# Patient Record
Sex: Male | Born: 1970 | Race: Black or African American | Hispanic: No | Marital: Married | Smoking: Current every day smoker
Health system: Southern US, Community
[De-identification: ages and names within clinical notes are randomized; demographics above are authoritative.]

## PROBLEM LIST (undated history)

## (undated) DIAGNOSIS — K353 Acute appendicitis with localized peritonitis, without perforation or gangrene: Secondary | ICD-10-CM

## (undated) DIAGNOSIS — K3580 Unspecified acute appendicitis: Secondary | ICD-10-CM

## (undated) DIAGNOSIS — T7840XA Allergy, unspecified, initial encounter: Secondary | ICD-10-CM

## (undated) HISTORY — DX: Unspecified acute appendicitis: K35.80

## (undated) HISTORY — DX: Acute appendicitis with localized peritonitis, without perforation or gangrene: K35.30

## (undated) HISTORY — DX: Allergy, unspecified, initial encounter: T78.40XA

## (undated) HISTORY — PX: APPENDECTOMY: SHX54

---

## 2005-07-10 ENCOUNTER — Emergency Department: Payer: Self-pay | Admitting: Emergency Medicine

## 2007-02-02 ENCOUNTER — Ambulatory Visit: Payer: Self-pay | Admitting: Internal Medicine

## 2011-01-31 ENCOUNTER — Emergency Department: Payer: Self-pay | Admitting: Emergency Medicine

## 2013-06-20 ENCOUNTER — Ambulatory Visit: Payer: Self-pay

## 2013-06-20 LAB — RAPID STREP-A WITH REFLX: Micro Text Report: NEGATIVE

## 2013-06-24 LAB — BETA STREP CULTURE(ARMC)

## 2013-10-17 ENCOUNTER — Emergency Department: Payer: Self-pay | Admitting: Emergency Medicine

## 2016-10-04 ENCOUNTER — Encounter
Admission: EM | Disposition: A | Discharge status: Home / Self Care | Payer: Self-pay | Source: Home / Self Care | Attending: Emergency Medicine

## 2016-10-04 ENCOUNTER — Encounter: Payer: Self-pay | Admitting: Emergency Medicine

## 2016-10-04 ENCOUNTER — Emergency Department: Payer: Managed Care, Other (non HMO)

## 2016-10-04 ENCOUNTER — Observation Stay: Payer: Managed Care, Other (non HMO) | Admitting: Certified Registered Nurse Anesthetist

## 2016-10-04 ENCOUNTER — Observation Stay
Admission: EM | Admit: 2016-10-04 | Discharge: 2016-10-04 | Disposition: A | Discharge status: Home / Self Care | Payer: Managed Care, Other (non HMO) | Attending: Surgery | Admitting: Surgery

## 2016-10-04 DIAGNOSIS — K353 Acute appendicitis with localized peritonitis, without perforation or gangrene: Secondary | ICD-10-CM | POA: Diagnosis present

## 2016-10-04 DIAGNOSIS — F17210 Nicotine dependence, cigarettes, uncomplicated: Secondary | ICD-10-CM | POA: Insufficient documentation

## 2016-10-04 DIAGNOSIS — K3580 Unspecified acute appendicitis: Secondary | ICD-10-CM

## 2016-10-04 HISTORY — PX: LAPAROSCOPIC APPENDECTOMY: SHX408

## 2016-10-04 HISTORY — DX: Unspecified acute appendicitis: K35.80

## 2016-10-04 LAB — COMPREHENSIVE METABOLIC PANEL
ALK PHOS: 63 U/L (ref 38–126)
ALT: 19 U/L (ref 17–63)
ANION GAP: 9 (ref 5–15)
AST: 22 U/L (ref 15–41)
Albumin: 3.6 g/dL (ref 3.5–5.0)
BILIRUBIN TOTAL: 0.2 mg/dL — AB (ref 0.3–1.2)
BUN: 8 mg/dL (ref 6–20)
CALCIUM: 8.4 mg/dL — AB (ref 8.9–10.3)
CO2: 28 mmol/L (ref 22–32)
Chloride: 105 mmol/L (ref 101–111)
Creatinine, Ser: 0.93 mg/dL (ref 0.61–1.24)
Glucose, Bld: 107 mg/dL — ABNORMAL HIGH (ref 65–99)
Potassium: 3.5 mmol/L (ref 3.5–5.1)
SODIUM: 142 mmol/L (ref 135–145)
TOTAL PROTEIN: 8.1 g/dL (ref 6.5–8.1)

## 2016-10-04 LAB — URINALYSIS, COMPLETE (UACMP) WITH MICROSCOPIC
BILIRUBIN URINE: NEGATIVE
Bacteria, UA: NONE SEEN
Glucose, UA: NEGATIVE mg/dL
Hgb urine dipstick: NEGATIVE
Ketones, ur: NEGATIVE mg/dL
Leukocytes, UA: NEGATIVE
NITRITE: NEGATIVE
PH: 6 (ref 5.0–8.0)
Protein, ur: NEGATIVE mg/dL
RBC / HPF: NONE SEEN RBC/hpf (ref 0–5)
SPECIFIC GRAVITY, URINE: 1.006 (ref 1.005–1.030)
Squamous Epithelial / LPF: NONE SEEN

## 2016-10-04 LAB — CBC WITH DIFFERENTIAL/PLATELET
Basophils Absolute: 0 10*3/uL (ref 0–0.1)
Basophils Relative: 0 %
Eosinophils Absolute: 0.3 10*3/uL (ref 0–0.7)
Eosinophils Relative: 2 %
HCT: 40.2 % (ref 40.0–52.0)
Hemoglobin: 13.7 g/dL (ref 13.0–18.0)
LYMPHS ABS: 5.2 10*3/uL — AB (ref 1.0–3.6)
Lymphocytes Relative: 43 %
MCH: 25.5 pg — AB (ref 26.0–34.0)
MCHC: 34.1 g/dL (ref 32.0–36.0)
MCV: 75 fL — ABNORMAL LOW (ref 80.0–100.0)
MONOS PCT: 8 %
Monocytes Absolute: 0.9 10*3/uL (ref 0.2–1.0)
NEUTROS ABS: 5.7 10*3/uL (ref 1.4–6.5)
NEUTROS PCT: 47 %
Platelets: 220 10*3/uL (ref 150–440)
RBC: 5.36 MIL/uL (ref 4.40–5.90)
RDW: 16.4 % — ABNORMAL HIGH (ref 11.5–14.5)
WBC: 12.2 10*3/uL — ABNORMAL HIGH (ref 3.8–10.6)

## 2016-10-04 LAB — LIPASE, BLOOD: LIPASE: 33 U/L (ref 11–51)

## 2016-10-04 SURGERY — APPENDECTOMY, LAPAROSCOPIC
Anesthesia: General

## 2016-10-04 MED ORDER — EPHEDRINE SULFATE 50 MG/ML IJ SOLN
INTRAMUSCULAR | Status: AC
  Filled 2016-10-04: qty 1

## 2016-10-04 MED ORDER — LACTATED RINGERS IV SOLN
INTRAVENOUS | Status: DC | PRN
  Administered 2016-10-04: 08:00:00 via INTRAVENOUS

## 2016-10-04 MED ORDER — DEXTROSE-NACL 5-0.9 % IV SOLN: INTRAVENOUS | Status: DC

## 2016-10-04 MED ORDER — SUGAMMADEX SODIUM 200 MG/2ML IV SOLN
INTRAVENOUS | Status: AC
  Filled 2016-10-04: qty 2

## 2016-10-04 MED ORDER — ROCURONIUM BROMIDE 100 MG/10ML IV SOLN
INTRAVENOUS | Status: AC
Start: 2016-10-04 — End: 2016-10-04
  Filled 2016-10-04: qty 1

## 2016-10-04 MED ORDER — HEPARIN SODIUM (PORCINE) 5000 UNIT/ML IJ SOLN: 5000.0000 [IU] | Freq: Three times a day (TID) | INTRAMUSCULAR | Status: DC

## 2016-10-04 MED ORDER — FENTANYL CITRATE (PF) 100 MCG/2ML IJ SOLN
INTRAMUSCULAR | Status: AC
  Filled 2016-10-04: qty 2

## 2016-10-04 MED ORDER — SODIUM CHLORIDE 0.9 % IV BOLUS (SEPSIS)
1000.0000 mL | Freq: Once | INTRAVENOUS | Status: AC
  Administered 2016-10-04: 1000 mL via INTRAVENOUS

## 2016-10-04 MED ORDER — MIDAZOLAM HCL 2 MG/2ML IJ SOLN
INTRAMUSCULAR | Status: AC
  Filled 2016-10-04: qty 2

## 2016-10-04 MED ORDER — FENTANYL CITRATE (PF) 100 MCG/2ML IJ SOLN
INTRAMUSCULAR | Status: DC | PRN
  Administered 2016-10-04: 100 ug via INTRAVENOUS

## 2016-10-04 MED ORDER — SUGAMMADEX SODIUM 200 MG/2ML IV SOLN
INTRAVENOUS | Status: DC | PRN
  Administered 2016-10-04: 200 mg via INTRAVENOUS

## 2016-10-04 MED ORDER — LIDOCAINE HCL (PF) 2 % IJ SOLN
INTRAMUSCULAR | Status: AC
  Filled 2016-10-04: qty 2

## 2016-10-04 MED ORDER — FENTANYL CITRATE (PF) 100 MCG/2ML IJ SOLN: 25.0000 ug | INTRAMUSCULAR | Status: DC | PRN

## 2016-10-04 MED ORDER — PROPOFOL 10 MG/ML IV BOLUS
INTRAVENOUS | Status: DC | PRN
  Administered 2016-10-04: 200 mg via INTRAVENOUS

## 2016-10-04 MED ORDER — IOPAMIDOL (ISOVUE-300) INJECTION 61%
30.0000 mL | Freq: Once | INTRAVENOUS | Status: AC | PRN
  Administered 2016-10-04: 30 mL via ORAL

## 2016-10-04 MED ORDER — HYDROMORPHONE HCL 1 MG/ML IJ SOLN
INTRAMUSCULAR | Status: AC
  Filled 2016-10-04: qty 1

## 2016-10-04 MED ORDER — GLYCOPYRROLATE 0.2 MG/ML IJ SOLN
INTRAMUSCULAR | Status: AC
  Filled 2016-10-04: qty 1

## 2016-10-04 MED ORDER — MORPHINE SULFATE (PF) 4 MG/ML IV SOLN: 4.0000 mg | INTRAVENOUS | Status: DC | PRN

## 2016-10-04 MED ORDER — ONDANSETRON HCL 4 MG/2ML IJ SOLN: 4.0000 mg | Freq: Once | INTRAMUSCULAR | Status: DC | PRN

## 2016-10-04 MED ORDER — ONDANSETRON HCL 4 MG/2ML IJ SOLN
INTRAMUSCULAR | Status: AC
  Filled 2016-10-04: qty 2

## 2016-10-04 MED ORDER — MIDAZOLAM HCL 2 MG/2ML IJ SOLN
INTRAMUSCULAR | Status: DC | PRN
  Administered 2016-10-04: 2 mg via INTRAVENOUS

## 2016-10-04 MED ORDER — HYDROMORPHONE HCL 1 MG/ML IJ SOLN
INTRAMUSCULAR | Status: DC | PRN
  Administered 2016-10-04: 1 mg via INTRAVENOUS

## 2016-10-04 MED ORDER — ONDANSETRON HCL 4 MG/2ML IJ SOLN: 4.0000 mg | Freq: Four times a day (QID) | INTRAMUSCULAR | Status: DC | PRN

## 2016-10-04 MED ORDER — HYDROCODONE-ACETAMINOPHEN 5-325 MG PO TABS
1.0000 | ORAL_TABLET | ORAL | Status: DC | PRN
  Administered 2016-10-04: 1 via ORAL
  Filled 2016-10-04: qty 1

## 2016-10-04 MED ORDER — HYDROCODONE-ACETAMINOPHEN 5-325 MG PO TABS: 1.0000 | ORAL_TABLET | ORAL | 0 refills | Status: DC | PRN

## 2016-10-04 MED ORDER — PIPERACILLIN-TAZOBACTAM 3.375 G IVPB
INTRAVENOUS | Status: AC
  Filled 2016-10-04: qty 50

## 2016-10-04 MED ORDER — BUPIVACAINE-EPINEPHRINE (PF) 0.25% -1:200000 IJ SOLN
INTRAMUSCULAR | Status: DC | PRN
Start: 2016-10-04 — End: 2016-10-04
  Administered 2016-10-04: 30 mL via PERINEURAL

## 2016-10-04 MED ORDER — SUCCINYLCHOLINE CHLORIDE 20 MG/ML IJ SOLN
INTRAMUSCULAR | Status: DC | PRN
  Administered 2016-10-04: 100 mg via INTRAVENOUS

## 2016-10-04 MED ORDER — SUCCINYLCHOLINE CHLORIDE 20 MG/ML IJ SOLN
INTRAMUSCULAR | Status: AC
  Filled 2016-10-04: qty 1

## 2016-10-04 MED ORDER — DEXAMETHASONE SODIUM PHOSPHATE 10 MG/ML IJ SOLN
INTRAMUSCULAR | Status: DC | PRN
  Administered 2016-10-04: 10 mg via INTRAVENOUS

## 2016-10-04 MED ORDER — LIDOCAINE HCL (CARDIAC) 20 MG/ML IV SOLN
INTRAVENOUS | Status: DC | PRN
Start: 2016-10-04 — End: 2016-10-04
  Administered 2016-10-04: 100 mg via INTRAVENOUS

## 2016-10-04 MED ORDER — IOPAMIDOL (ISOVUE-300) INJECTION 61%
100.0000 mL | Freq: Once | INTRAVENOUS | Status: AC | PRN
  Administered 2016-10-04: 100 mL via INTRAVENOUS

## 2016-10-04 MED ORDER — ONDANSETRON HCL 4 MG/2ML IJ SOLN
INTRAMUSCULAR | Status: DC | PRN
  Administered 2016-10-04: 4 mg via INTRAVENOUS

## 2016-10-04 MED ORDER — DEXAMETHASONE SODIUM PHOSPHATE 10 MG/ML IJ SOLN
INTRAMUSCULAR | Status: AC
  Filled 2016-10-04: qty 1

## 2016-10-04 MED ORDER — BUPIVACAINE-EPINEPHRINE (PF) 0.25% -1:200000 IJ SOLN
INTRAMUSCULAR | Status: AC
  Filled 2016-10-04: qty 30

## 2016-10-04 MED ORDER — ONDANSETRON HCL 4 MG PO TABS: 4.0000 mg | ORAL_TABLET | Freq: Four times a day (QID) | ORAL | Status: DC | PRN

## 2016-10-04 MED ORDER — PHENYLEPHRINE HCL 10 MG/ML IJ SOLN
INTRAMUSCULAR | Status: AC
  Filled 2016-10-04: qty 1

## 2016-10-04 MED ORDER — ROCURONIUM BROMIDE 100 MG/10ML IV SOLN
INTRAVENOUS | Status: DC | PRN
  Administered 2016-10-04: 30 mg via INTRAVENOUS
  Administered 2016-10-04: 5 mg via INTRAVENOUS

## 2016-10-04 MED ORDER — PROPOFOL 10 MG/ML IV BOLUS
INTRAVENOUS | Status: AC
  Filled 2016-10-04: qty 20

## 2016-10-04 SURGICAL SUPPLY — 41 items
ADHESIVE MASTISOL STRL (MISCELLANEOUS) ×3 IMPLANT
APPLIER CLIP ROT 10 11.4 M/L (STAPLE) ×3
BLADE SURG SZ11 CARB STEEL (BLADE) ×3 IMPLANT
CANISTER SUCT 3000ML PPV (MISCELLANEOUS) ×3 IMPLANT
CATH TRAY 16F METER LATEX (MISCELLANEOUS) ×3 IMPLANT
CHLORAPREP W/TINT 26ML (MISCELLANEOUS) ×3 IMPLANT
CLIP APPLIE ROT 10 11.4 M/L (STAPLE) ×1 IMPLANT
CLOSURE WOUND 1/2 X4 (GAUZE/BANDAGES/DRESSINGS) ×1
CUTTER FLEX LINEAR 45M (STAPLE) ×3 IMPLANT
DEVICE TROCAR PUNCTURE CLOSURE (ENDOMECHANICALS) ×3 IMPLANT
ELECT REM PT RETURN 9FT ADLT (ELECTROSURGICAL) ×3
ELECTRODE REM PT RTRN 9FT ADLT (ELECTROSURGICAL) ×1 IMPLANT
ENDOPOUCH RETRIEVER 10 (MISCELLANEOUS) ×3 IMPLANT
GAUZE SPONGE NON-WVN 2X2 STRL (MISCELLANEOUS) ×3 IMPLANT
GLOVE BIO SURGEON STRL SZ8 (GLOVE) ×3 IMPLANT
GOWN STRL REUS W/ TWL LRG LVL3 (GOWN DISPOSABLE) ×2 IMPLANT
GOWN STRL REUS W/TWL LRG LVL3 (GOWN DISPOSABLE) ×4
IRRIGATION STRYKERFLOW (MISCELLANEOUS) ×1 IMPLANT
IRRIGATOR STRYKERFLOW (MISCELLANEOUS) ×3
KIT RM TURNOVER STRD PROC AR (KITS) ×3 IMPLANT
LABEL OR SOLS (LABEL) ×3 IMPLANT
NDL SAFETY 22GX1.5 (NEEDLE) ×3 IMPLANT
NEEDLE VERESS 14GA 120MM (NEEDLE) ×3 IMPLANT
NS IRRIG 500ML POUR BTL (IV SOLUTION) ×3 IMPLANT
PACK LAP CHOLECYSTECTOMY (MISCELLANEOUS) ×3 IMPLANT
RELOAD 45 VASCULAR/THIN (ENDOMECHANICALS) ×3 IMPLANT
RELOAD STAPLE TA45 3.5 REG BLU (ENDOMECHANICALS) ×3 IMPLANT
SCISSORS METZENBAUM CVD 33 (INSTRUMENTS) IMPLANT
SLEEVE ENDOPATH XCEL 5M (ENDOMECHANICALS) ×3 IMPLANT
SOL .9 NS 3000ML IRR  AL (IV SOLUTION) ×2
SOL .9 NS 3000ML IRR UROMATIC (IV SOLUTION) ×1 IMPLANT
SPONGE LAP 18X18 5 PK (GAUZE/BANDAGES/DRESSINGS) ×3 IMPLANT
SPONGE VERSALON 2X2 STRL (MISCELLANEOUS) ×6
STRIP CLOSURE SKIN 1/2X4 (GAUZE/BANDAGES/DRESSINGS) ×2 IMPLANT
SUT MNCRL 4-0 (SUTURE) ×2
SUT MNCRL 4-0 27XMFL (SUTURE) ×1
SUT VICRYL 0 TIES 12 18 (SUTURE) ×3 IMPLANT
SUTURE MNCRL 4-0 27XMF (SUTURE) ×1 IMPLANT
TROCAR XCEL 12X100 BLDLESS (ENDOMECHANICALS) ×3 IMPLANT
TROCAR XCEL NON-BLD 5MMX100MML (ENDOMECHANICALS) ×3 IMPLANT
TUBING INSUFFLATOR HI FLOW (MISCELLANEOUS) ×3 IMPLANT

## 2016-10-04 NOTE — ED Triage Notes (Signed)
Pt presents to ED 09 c/o RLQ abdominal pain that started last night; per EMS pain was at 8-9/10; EMS gave 15mg  toradol and pain decreased to 5-6/10; EMS also gave 4mg  zofran for nausea; pt is awake, alert, and oriented x4;

## 2016-10-04 NOTE — Anesthesia Post-op Follow-up Note (Cosign Needed)
Anesthesia QCDR form completed.        

## 2016-10-04 NOTE — H&P (Signed)
Patient ID: Aaron Woodard, male   DOB: [birth year], 46 y.o.   MRN: [id number]  HPI Aaron Woodard is a 46 y.o. male presented with acute onset of abdominal pain starting last night around 11:00 pm. He reports that the pain is located in the right lower quadrant, is constant and sharp and moderate in intensity. Worsen with certain movements. He did have associated nausea and decreased appetite. No emesis, no fevers no chills no diarrhea. His workup revealed an increase in the white count of 12,000 normal creatinine. CT scan personal review there is evidence of dilated appendix with periappendiceal inflammation. No abscesses no free air. He is able to perform more than 6 Mets of activity without shortness of breath or chest pain. He does drink 2-3 beers 3 times a week and smokes daily  HPI   History reviewed. No pertinent past medical history.  History reviewed. No pertinent surgical history.  No pertinent fam hx Social History Social History  Substance Use Topics  . Smoking status: Current Every Day Smoker    Packs/day: 0.50    Types: Cigarettes  . Smokeless tobacco: Never Used  . Alcohol use 6.0 - 8.4 oz/week    10 - 14 Standard drinks or equivalent per week    Not on File  No current facility-administered medications for this encounter.    No current outpatient prescriptions on file.     Review of Systems Full ROS  was asked and was negative except for the information on the HPI  Physical Exam Blood pressure (!) 118/91, pulse 75, temperature 98.1 F (36.7 C), temperature source Oral, resp. rate 15, height 5\' 10"  (1.778 m), weight 104.3 kg (230 lb), SpO2 98 %. CONSTITUTIONAL: *NAD EYES: Pupils are equal, round, and reactive to light, Sclera are non-icteric. EARS, NOSE, MOUTH AND THROAT: The oropharynx is clear. The oral mucosa is pink and moist. Hearing is intact to voice. LYMPH NODES:  Lymph nodes in the neck are normal. RESPIRATORY:  Lungs are clear. There is normal  respiratory effort, with equal breath sounds bilaterally, and without pathologic use of accessory muscles. CARDIOVASCULAR: Heart is regular without murmurs, gallops, or rubs. GI: The abdomen is  soft, TTP RLQ w  positive Mcburney sign GU: Rectal deferred.   MUSCULOSKELETAL: Normal muscle strength and tone. No cyanosis or edema.   SKIN: Turgor is good and there are no pathologic skin lesions or ulcers. NEUROLOGIC: Motor and sensation is grossly normal. Cranial nerves are grossly intact. PSYCH:  Oriented to person, place and time. Affect is normal.  Data Reviewed I have personally reviewed the patient's imaging, laboratory findings and medical records.    Assessment/ Plan 46 year old male with signs and symptoms consistent with appendicitis and confirmed by CT scan. Discussed with the patient about his disease process and I do recommend admission to hospital and proceeding with an appendectomy. We'll go ahead and give him some fluids, broad-spectrum antibiotics and is scheduled for an appendectomy. The risks, benefits, complications, treatment options, and expected outcomes were discussed with the patient. The treatment of antibiotics alone was discussed giving a 20% chance that this could fail and surgery would be necessary.  Also discussed continuing to the operating room for Laparoscopic Appendectomy.  The possibilities of  bleeding, recurrent infection, perforation of viscus, finding a normal appendix, the need for additional procedures, failure to diagnose a condition, conversion to open procedure and creating a complication requiring transfusion or further operations were discussed. The patient was given the opportunity to ask questions and have  them answered.  Patient would like to proceed with Laparoscopic Appendectomy. D/W OR Team and there is space available at [facility]. Dr. Burt Knack will be performing the procedure. D/w the pt in detail and he understands.  Caroleen Hamman, MD FACS General  Surgeon 10/04/2016,

## 2016-10-04 NOTE — Anesthesia Preprocedure Evaluation (Addendum)
Anesthesia Evaluation  Patient identified by MRN, date of birth, ID band Patient awake    Reviewed: Allergy & Precautions, NPO status , Patient's Chart, lab work & pertinent test results  Airway Mallampati: III  TM Distance: >3 FB     Dental  (+) Chipped   Pulmonary Current Smoker,    Pulmonary exam normal        Cardiovascular negative cardio ROS Normal cardiovascular exam     Neuro/Psych negative neurological ROS  negative psych ROS   GI/Hepatic Neg liver ROS, Acute appendix   Endo/Other  negative endocrine ROS  Renal/GU negative Renal ROS     Musculoskeletal negative musculoskeletal ROS (+)   Abdominal (+) + obese,  Abdomen: tender.    Peds negative pediatric ROS (+)  Hematology negative hematology ROS (+)   Anesthesia Other Findings History reviewed. No pertinent past medical history.  Reproductive/Obstetrics                            Anesthesia Physical Anesthesia Plan  ASA: II and emergent  Anesthesia Plan: General   Post-op Pain Management:    Induction: Intravenous and Rapid sequence  Airway Management Planned: Oral ETT  Additional Equipment:   Intra-op Plan:   Post-operative Plan: Extubation in OR  Informed Consent: I have reviewed the patients History and Physical, chart, labs and discussed the procedure including the risks, benefits and alternatives for the proposed anesthesia with the patient or authorized representative who has indicated his/her understanding and acceptance.   Dental advisory given  Plan Discussed with: CRNA and Surgeon  Anesthesia Plan Comments:         Anesthesia Quick Evaluation

## 2016-10-04 NOTE — Anesthesia Procedure Notes (Signed)
Procedure Name: Intubation Date/Time: 10/04/2016 8:17 AM Performed by: Darlyne Russian Pre-anesthesia Checklist: Patient identified, Emergency Drugs available, Suction available, Patient being monitored and Timeout performed Patient Re-evaluated:Patient Re-evaluated prior to inductionOxygen Delivery Method: Circle system utilized Preoxygenation: Pre-oxygenation with 100% oxygen Intubation Type: IV induction Ventilation: Oral airway inserted - appropriate to patient size and Mask ventilation with difficulty Laryngoscope Size: Mac and 4 Grade View: Grade III Tube type: Oral Tube size: 7.5 mm Number of attempts: 1 Airway Equipment and Method: Stylet and Bite block (Soft bite block placed between left molars.) Placement Confirmation: ETT inserted through vocal cords under direct vision,  positive ETCO2 and breath sounds checked- equal and bilateral Secured at: 24 cm Tube secured with: Tape Dental Injury: Teeth and Oropharynx as per pre-operative assessment

## 2016-10-04 NOTE — ED Provider Notes (Signed)
[facility] Emergency Department Provider Note    First MD Initiated Contact with Patient 10/04/16 [phone]     (approximate)  I have reviewed the triage vital signs and the nursing notes.   HISTORY  Chief Complaint Abdominal Pain   HPI Aaron Woodard is a 46 y.o. male presents with acute onset of 10 out of 10 sharp right lower quadrant painand nausea.    Past medical history Noncontributory  Patient Active Problem List   Diagnosis Date Noted  . Acute appendicitis with localized peritonitis     Past surgical history None  Prior to Admission medications   Not on File    Allergies No known drug allergies  Family history Noncontributory  Social History Social History  Substance Use Topics  . Smoking status: Current Every Day Smoker    Packs/day: 0.50    Types: Cigarettes  . Smokeless tobacco: Never Used  . Alcohol use 6.0 - 8.4 oz/week    10 - 14 Standard drinks or equivalent per week    Review of Systems Constitutional: No fever/chills Eyes: No visual changes. ENT: No sore throat. Cardiovascular: Denies chest pain. Respiratory: Denies shortness of breath. Gastrointestinal: Positive for abdominal pain.  Positive for nausea, no vomiting.  No diarrhea.  No constipation. Genitourinary: Negative for dysuria. Musculoskeletal: Negative for neck pain.  Negative for back pain. Integumentary: Negative for rash. Neurological: Negative for headaches, focal weakness or numbness.   ____________________________________________   PHYSICAL EXAM:  VITAL SIGNS: ED Triage Vitals  Enc Vitals Group     BP 10/04/16 0100 (!) 118/91     Pulse Rate 10/04/16 0100 67     Resp 10/04/16 0104 15     Temp 10/04/16 0104 98.1 F (36.7 C)     Temp Source 10/04/16 0104 Oral     SpO2 10/04/16 0100 96 %     Weight 10/04/16 0104 230 lb (104.3 kg)     Height 10/04/16 0104 5\' 10"  (1.778 m)     Head Circumference --      Peak Flow --      Pain Score  10/04/16 0103 5     Pain Loc --      Pain Edu? --      Excl. in [place]? --     Constitutional: Alert and oriented. Well appearing and in no acute distress. Eyes: Conjunctivae are normal.  Head: Atraumatic. Mouth/Throat: Mucous membranes are moist. Oropharynx non-erythematous. Neck: No stridor.  Cardiovascular: Normal rate, regular rhythm. Good peripheral circulation. Grossly normal heart sounds. Respiratory: Normal respiratory effort.  No retractions. Lungs CTAB. Gastrointestinal: Right lower quadrant tenderness to palpation. No distention.  Musculoskeletal: No lower extremity tenderness nor edema. No gross deformities of extremities. Neurologic:  Normal speech and language. No gross focal neurologic deficits are appreciated.  Skin:  Skin is warm, dry and intact. No rash noted. Psychiatric: Mood and affect are normal. Speech and behavior are normal.  ____________________________________________   LABS (all labs ordered are listed, but only abnormal results are displayed)  Labs Reviewed  CBC WITH DIFFERENTIAL/PLATELET - Abnormal; Notable for the following:       Result Value   WBC 12.2 (*)    MCV 75.0 (*)    MCH 25.5 (*)    RDW 16.4 (*)    Lymphs Abs 5.2 (*)    All other components within normal limits  COMPREHENSIVE METABOLIC PANEL - Abnormal; Notable for the following:    Glucose, Bld 107 (*)    Calcium 8.4 (*)  Total Bilirubin 0.2 (*)    All other components within normal limits  URINALYSIS, COMPLETE (UACMP) WITH MICROSCOPIC - Abnormal; Notable for the following:    Color, Urine YELLOW (*)    APPearance CLEAR (*)    All other components within normal limits  LIPASE, BLOOD     RADIOLOGY I, Woodland N Tabor Denham, personally viewed and evaluated these images (plain radiographs) as part of my medical decision making, as well as reviewing the written report by the radiologist.  Ct Abdomen Pelvis W Contrast  Result Date: 10/04/2016 CLINICAL DATA:  Right lower quadrant  abdominal pain. EXAM: CT ABDOMEN AND PELVIS WITH CONTRAST TECHNIQUE: Multidetector CT imaging of the abdomen and pelvis was performed using the standard protocol following bolus administration of intravenous contrast. CONTRAST:  154mL ISOVUE-300 IOPAMIDOL (ISOVUE-300) INJECTION 61% COMPARISON:  None. FINDINGS: Lower chest: Minimal dependent atelectasis. Lung bases are otherwise clear. Hepatobiliary: Subcentimeter hypodensity in the left lobe is too small to characterize, likely small cyst or hemangioma. Gallbladder is decompressed, no calcified gallstone. No biliary dilatation. Pancreas: No ductal dilatation or inflammation. Spleen: Normal in size without focal abnormality. Adrenals/Urinary Tract: Adrenal glands are unremarkable. Kidneys are normal, without renal calculi, focal lesion, or hydronephrosis. Bladder is distended, otherwise unremarkable. Stomach/Bowel: The appendix is borderline measuring 7-8 mm, possible wall thickening proximally. There is air at the appendiceal tip. There is equivocal periappendiceal soft tissue stranding. Fecalization of distal small bowel contents suggest slow transit and may be due to appendiceal inflammation. No obstruction. No small or large bowel wall thickening. Stomach physiologically distended with enteric contrast. Trace hiatal hernia. Vascular/Lymphatic: Mild bi-iliac atherosclerosis. No aneurysm. No adenopathy. Reproductive: Prostate is unremarkable. Other: No free air, free fluid, or intra-abdominal fluid collection. Tiny fat containing umbilical hernia. Musculoskeletal: There is sclerosis involving the sacrum and iliac aspect of both sacroiliac joints. This appears relatively symmetric. Scleroses involves enlarged left transverse process of L5. No acute fracture. IMPRESSION: 1. Findings suspicious for early acute appendicitis. Borderline appendiceal dilatation, wall thickening and faint periappendiceal soft tissue stranding. 2. Symmetric scleroses about bilateral  sacroiliac joints. This suggests sacroiliitis, which can be seen with ankylosing spondylitis, inflammatory bowel disease or psoriatic arthritis, however the latter is usually more asymmetric. Electronically Signed   By: Jeb Levering M.D.   On: 10/04/2016 02:53     Procedures   ____________________________________________   INITIAL IMPRESSION / ASSESSMENT AND PLAN / ED COURSE  Pertinent labs & imaging results that were available during my care of the patient were reviewed by me and considered in my medical decision making (see chart for details).  History of physical exam concern for possible appendicitis as such CT scan of the abdomen and pelvis performed which revealed acute appendicitis. I informed the patient of all clinical findings. Dr.Pabon evaluated the patient in the emergency department & concurred with CT findings. Plan for appendectomy      ____________________________________________  FINAL CLINICAL IMPRESSION(S) / ED DIAGNOSES  Final diagnoses:  Acute appendicitis, unspecified acute appendicitis type     MEDICATIONS GIVEN DURING THIS VISIT:  Medications  sodium chloride 0.9 % bolus 1,000 mL (0 mLs Intravenous Stopped 10/04/16 0235)  iopamidol (ISOVUE-300) 61 % injection 30 mL (30 mLs Oral Contrast Given 10/04/16 0118)  iopamidol (ISOVUE-300) 61 % injection 100 mL (100 mLs Intravenous Contrast Given 10/04/16 0212)     NEW OUTPATIENT MEDICATIONS STARTED DURING THIS VISIT:  New Prescriptions   No medications on file    Modified Medications   No medications on file  Discontinued Medications   No medications on file     Note:  This document was prepared using Dragon voice recognition software and may include unintentional dictation errors.    Gregor Hams, MD 10/04/16 0400

## 2016-10-04 NOTE — Progress Notes (Signed)
Patient feels well and wants to be discharged his tolerating a regular diet. Wounds are clean with dressings in place

## 2016-10-04 NOTE — Op Note (Signed)
laparascopic appendectomy   [name] Date of operation:  10/04/2016  Indications: The patient presented with a history of  abdominal pain. Workup has revealed findings consistent with acute appendicitis.  Pre-operative Diagnosis: Acute appendicitis  Post-operative Diagnosis: Acute appendicitis, nonruptured  Surgeon: Jerrol Banana. Burt Knack, MD, FACS  Anesthesia: General with endotracheal tube  Procedure Details  The patient was seen again in the preop area. The options of surgery versus observation were reviewed with the patient and/or family. The risks of bleeding, infection, recurrence of symptoms, negative laparoscopy, potential for an open procedure, bowel injury, abscess or infection, were all reviewed as well. The patient was taken to Operating Room, identified as Aaron Woodard and the procedure verified as laparoscopic appendectomy. A Time Out was held and the above information confirmed.  The patient was placed in the supine position and general anesthesia was induced.  Antibiotic prophylaxis was administered and VT E prophylaxis was in place. A Foley catheter was placed by the nursing staff.   The abdomen was prepped and draped in a sterile fashion. An infraumbilical incision was made. A Veress needle was placed and pneumoperitoneum was obtained. A 5 mm trocar port was placed without difficulty and the abdominal cavity was explored.  Under direct vision a 5 mm suprapubic port was placed and a 13 mm left lateral port was placed all under direct vision.  The appendix was identified and found to be acutely inflamed but nonruptured . The appendix was carefully dissected. The base of the appendix was dissected out and divided with a standard load Endo GIA. The mesoappendix was divided with a vascular load Endo GIA. 2 loads were required. There was considerable bleeding along the cyst vascular staple line which was reinforced and controlled with clips. The appendix was passed out through the  left lateral port site with the aid of an Endo Catch bag. The right lower quadrant and pelvis was then irrigated with copious amounts of normal saline which was aspirated. Inspection  failed to identify any additional bleeding and there were no signs of bowel injury. Therefore the left lateral port site was closed under direct vision utilizing an Endo Close technique with 0 Vicryl interrupted sutures, all under direct vision.   Again the right lower quadrant was inspected there was no sign of bleeding or bowel injury therefore pneumoperitoneum was released, all ports were removed and the skin incisions were approximated with subcuticular 4-0 Monocryl. Steri-Strips and Mastisol and sterile dressings were placed.  The patient tolerated the procedure well, there were no complications. The sponge lap and needle count were correct at the end of the procedure.  The patient was taken to the recovery room in stable condition to be admitted for continued care.  Findings acute appendicitis nonruptured  Estimated Blood Loss: 20 cc                  Specimens: appendix         Complications:  None                  Aaron Woodard E. Burt Knack MD, FACS

## 2016-10-04 NOTE — Addendum Note (Signed)
Addendum  created 10/04/16 1006 by Darlyne Russian, CRNA   Anesthesia Intra Flowsheets edited

## 2016-10-04 NOTE — ED Notes (Signed)
Pt transported to OR

## 2016-10-04 NOTE — Progress Notes (Signed)
Preoperative Review   Patient is met in the ED. The history is reviewed in the chart and with the patient. I personally reviewed the options and rationale as well as the risks of this procedure that have been previously discussed with the patient. All questions asked by the patient and/or family were answered to their satisfaction.  Patient agrees to proceed with this procedure at this time.  Florene Glen M.D. FACS

## 2016-10-04 NOTE — Anesthesia Postprocedure Evaluation (Signed)
Anesthesia Post Note  Patient: Aaron Woodard  Procedure(s) Performed: Procedure(s) (LRB): APPENDECTOMY LAPAROSCOPIC (N/A)  Patient location during evaluation: PACU Anesthesia Type: General Level of consciousness: awake and alert and oriented Pain management: pain level controlled Vital Signs Assessment: post-procedure vital signs reviewed and stable Respiratory status: spontaneous breathing Cardiovascular status: blood pressure returned to baseline Anesthetic complications: no     Last Vitals:  Vitals:   10/04/16 0630 10/04/16 0923  BP: 101/74 121/81  Pulse: 61 93  Resp:  (!) 27  Temp:  36.7 C    Last Pain:  Vitals:   10/04/16 0923  TempSrc: Temporal  PainSc: Asleep                 [name]

## 2016-10-04 NOTE — Progress Notes (Signed)
Patient discharged to home. Concerns addressed. IV site removed  

## 2016-10-04 NOTE — Transfer of Care (Signed)
Immediate Anesthesia Transfer of Care Note  Patient: Aaron Woodard  Procedure(s) Performed: Procedure(s): APPENDECTOMY LAPAROSCOPIC (N/A)  Patient Location: PACU  Anesthesia Type:General  Level of Consciousness: drowsy and responds to stimulation  Airway & Oxygen Therapy: Patient Spontanous Breathing and Patient connected to nasal cannula oxygen  Post-op Assessment: Report given to RN and Post -op Vital signs reviewed and stable  Post vital signs: Reviewed and stable  Last Vitals:  Vitals:   10/04/16 0630 10/04/16 0923  BP: 101/74 121/81  Pulse: 61 93  Resp:  (!) 27  Temp:  36.7 C    Last Pain:  Vitals:   10/04/16 0923  TempSrc: Temporal  PainSc: 0-No pain         Complications: No apparent anesthesia complications

## 2016-10-04 NOTE — Discharge Instructions (Signed)
Remove dressing in 24 hours. °May shower in 24 hours. °Leave paper strips in place. °Resume all home medications. °Follow-up with Dr. Deneka Greenwalt in 10 days. °

## 2016-10-04 NOTE — Discharge Summary (Signed)
Physician Discharge Summary  Patient ID: Aaron Woodard MRN: [id number] DOB/AGE: Dec 17, 1970 46 y.o.  Admit date: 10/04/2016 Discharge date: 10/04/2016   Discharge Diagnoses:  Active Problems:   Acute appendicitis with localized peritonitis   Appendicitis, acute   Procedures:Laparoscopic appendectomy  Hospital Course: This patient admitted the hospital with a diagnosis of acute appendicitis he was taken the operating room where laparoscopic appendectomy was performed without difficulty. He is tolerating a regular diet wishes to be discharged at this time he is given instructions to remove dressing tomorrow leaving Steri-Strips in place and to shower tomorrow he will follow-up in our office in 10 days no heavy lifting.  Consults: None  Disposition: Final discharge disposition not confirmed   Allergies as of 10/04/2016   No Known Allergies     Medication List    TAKE these medications   HYDROcodone-acetaminophen 5-325 MG tablet Commonly known as:  NORCO/VICODIN Take 1-2 tablets by mouth every 4 (four) hours as needed for moderate pain.      Follow-up Information    Florene Glen, MD Follow up in 10 day(s).   Specialty:  Surgery Contact information: [address] [place] [address] [phone]           Florene Glen, MD, FACS

## 2016-10-05 ENCOUNTER — Encounter: Payer: Self-pay | Admitting: Surgery

## 2016-10-08 LAB — SURGICAL PATHOLOGY

## 2016-10-14 ENCOUNTER — Telehealth: Payer: Self-pay | Admitting: General Practice

## 2016-10-14 NOTE — Telephone Encounter (Signed)
Called patient back and asked how long he had been with the spasms. He stated that he started having the spasms two days ago and that they radiate from the front to back. Patient denied fever, chills, redness and drainage from his incisions. I asked patient how often he was taking pain medications if any. He stated that he continues to take Hydrocodone-Acetaminophen 5-325 MG twice a day since he started with the spasms. He stated that the pain medication is helping him but wanted to know what else he could do. I recommended for him to drink plenty of fluids, walk around to help his bowels moving since he stated that he is going once every two days. Therefore, I also recommended for him to take Miralax 17 G twice a day to help him with his constipation since to me it sounded more like constipation. Patient was told to give [place] a call in case he continued to have the spasms after his second dosage of Miralax. Patient understood and had no further questions. I reminded him of his appointment for Thursday 10/16/2016.

## 2016-10-14 NOTE — Telephone Encounter (Signed)
Patient is calling, he is complaining of spasms from the front to the back, just started a couple days ago. On the left side gives him a little pain especially when he starts to spasm, pain level being at a 3 or 4. Patient is calling asking if this is normal. He is coming in on Thursday to see Dr. Adonis Huguenin. Please call and advice.

## 2016-10-16 ENCOUNTER — Ambulatory Visit (INDEPENDENT_AMBULATORY_CARE_PROVIDER_SITE_OTHER): Payer: Managed Care, Other (non HMO) | Admitting: General Surgery

## 2016-10-16 ENCOUNTER — Encounter: Payer: Self-pay | Admitting: General Surgery

## 2016-10-16 VITALS — BP 168/84 | HR 80 | Temp 97.8°F | Ht 70.0 in | Wt 217.4 lb

## 2016-10-16 DIAGNOSIS — Z4889 Encounter for other specified surgical aftercare: Secondary | ICD-10-CM | POA: Diagnosis not present

## 2016-10-16 MED ORDER — TRAMADOL HCL 50 MG PO TABS: 50.0000 mg | ORAL_TABLET | Freq: Four times a day (QID) | ORAL | 0 refills | Status: DC | PRN

## 2016-10-16 NOTE — Patient Instructions (Signed)
We will see you next week to make sure that you are doing better. Then we will determine when you could return to work.

## 2016-10-16 NOTE — Progress Notes (Signed)
Outpatient Surgical Follow Up  10/16/2016  Aaron Woodard is an 46 y.o. male.   Chief Complaint  Patient presents with  . Routine Post Op    Laparoscopic Appendectomy (10/04/16)- Dr. Burt Woodard    HPI: 46 year old male returns to clinic for follow-up 2 weeks status post laparoscopic appendectomy. Patient reports she is eating well but still having abdominal pain. He reports that he has spasms to go across his entire abdomen starting his left lower quadrant. This has intensified over the last couple days. They come out of the blue and require medications and respiratory resolve. Otherwise he states he is doing well. He denies any fevers, chills, nausea, vomiting, chest pain, shortness of breath, diarrhea, constipation.  Past Medical History:  Diagnosis Date  . Acute appendicitis with localized peritonitis   . Appendicitis, acute 10/04/2016    Past Surgical History:  Procedure Laterality Date  . LAPAROSCOPIC APPENDECTOMY N/A 10/04/2016   Procedure: APPENDECTOMY LAPAROSCOPIC;  Surgeon: Aaron Glen, MD;  Location: [facility] ORS;  Service: General;  Laterality: N/A;    Family History  Problem Relation Age of Onset  . Diabetes Mother   . Hypertension Mother   . Heart disease Mother   . Healthy Father     Social History:  reports that he has been smoking Cigarettes.  He has been smoking about 0.50 packs per day. He has never used smokeless tobacco. He reports that he drinks about 6.0 - 8.4 oz of alcohol per week . He reports that he does not use drugs.  Allergies: No Known Allergies  Medications reviewed.    ROS A multipoint review of systems was complete, all pertinent positives and negatives are documented within the history of present illness and remainder are negative   BP (!) 168/84   Pulse 80   Temp 97.8 F (36.6 C) (Oral)   Ht 5\' 10"  (1.778 m)   Wt 98.6 kg (217 lb 6.4 oz)   BMI 31.19 kg/m   Physical Exam Gen.: No acute distress Neck: Supple and nontender Chest: Clear  to auscultation Heart: Regular rate and rhythm Abdomen: Soft, nondistended. Minimally tender to palpation around the incision sites especially left lower quadrant. His laparoscopic incision sites are well approximated without any evidence of erythema or drainage.    No results found for this or any previous visit (from the past 48 hour(s)). No results found.  Assessment/Plan:  1. Aftercare following surgery 46 year old male status post laparoscopic appendectomy. Still having significant abdominal pain and spasms. We will provide him with a prescription for Ultram today and counseled him for strategies for relieving abdominal spasm. Patient voiced understanding and will follow-up in clinic next week to see his operative surgeon prior to being released to return to work.     Aaron Pert, MD FACS General Surgeon  10/16/2016,2:50 PM

## 2016-10-22 ENCOUNTER — Encounter: Payer: Self-pay | Admitting: Surgery

## 2016-10-22 ENCOUNTER — Ambulatory Visit (INDEPENDENT_AMBULATORY_CARE_PROVIDER_SITE_OTHER): Payer: Managed Care, Other (non HMO) | Admitting: Surgery

## 2016-10-22 VITALS — BP 133/85 | HR 80 | Temp 98.5°F | Ht 70.0 in | Wt 219.8 lb

## 2016-10-22 DIAGNOSIS — Z4889 Encounter for other specified surgical aftercare: Secondary | ICD-10-CM

## 2016-10-22 NOTE — Progress Notes (Signed)
Outpatient postop visit  10/22/2016  Aaron Woodard is an 46 y.o. male.    Procedure: Lap or scopic appendectomy  CC no problems  HPI: Patient feels well and is having no problems now he is tolerating his diet.  Medications reviewed.    Physical Exam:  BP 133/85   Pulse 80   Temp 98.5 F (36.9 C) (Oral)   Ht 5\' 10"  (1.778 m)   Wt 219 lb 12.8 oz (99.7 kg)   BMI 31.54 kg/m     PE: *Soft nontender abdomen no erythema no drainage wounds are clean    Assessment/Plan:  Pathology reviewed. Patient doing very well recommend follow up on an as-needed basis no lifting for 2 more weeks. But can return to normal duties with the only restriction being lifting of more 25 pounds.  Florene Glen, MD, FACS

## 2016-10-22 NOTE — Patient Instructions (Signed)
Please call our office with any questions or concerns.  Please do not submerge in a tub, hot tub, or pool until incisions are completely sealed.  Use sun block to incision area over the next year if this area will be exposed to sun. This helps decrease scarring.  You may resume your normal activities on 11/15/16. At that time- Listen to your body when lifting, if you have pain when lifting, stop and then try again in a few days. Pain after doing exercises or activities of daily living is normal as you get back in to your normal routine.  If you develop redness, drainage, or pain at incision sites- call our office immediately and speak with a nurse.

## 2016-11-02 ENCOUNTER — Encounter: Payer: Self-pay | Admitting: Emergency Medicine

## 2016-11-02 ENCOUNTER — Emergency Department
Admission: EM | Admit: 2016-11-02 | Discharge: 2016-11-02 | Disposition: A | Discharge status: Home / Self Care | Payer: Managed Care, Other (non HMO) | Attending: Student in an Organized Health Care Education/Training Program | Admitting: Student in an Organized Health Care Education/Training Program

## 2016-11-02 DIAGNOSIS — T783XXA Angioneurotic edema, initial encounter: Secondary | ICD-10-CM

## 2016-11-02 DIAGNOSIS — X58XXXA Exposure to other specified factors, initial encounter: Secondary | ICD-10-CM | POA: Diagnosis not present

## 2016-11-02 DIAGNOSIS — Y9389 Activity, other specified: Secondary | ICD-10-CM | POA: Insufficient documentation

## 2016-11-02 DIAGNOSIS — T781XXA Other adverse food reactions, not elsewhere classified, initial encounter: Secondary | ICD-10-CM | POA: Insufficient documentation

## 2016-11-02 DIAGNOSIS — Y999 Unspecified external cause status: Secondary | ICD-10-CM | POA: Diagnosis not present

## 2016-11-02 DIAGNOSIS — F17210 Nicotine dependence, cigarettes, uncomplicated: Secondary | ICD-10-CM | POA: Diagnosis not present

## 2016-11-02 DIAGNOSIS — Y929 Unspecified place or not applicable: Secondary | ICD-10-CM | POA: Insufficient documentation

## 2016-11-02 DIAGNOSIS — R600 Localized edema: Secondary | ICD-10-CM | POA: Diagnosis present

## 2016-11-02 MED ORDER — METHYLPREDNISOLONE SODIUM SUCC 125 MG IJ SOLR
125.0000 mg | Freq: Once | INTRAMUSCULAR | Status: AC
  Administered 2016-11-02: 125 mg via INTRAVENOUS
  Filled 2016-11-02: qty 2

## 2016-11-02 MED ORDER — EPINEPHRINE 0.3 MG/0.3ML IJ SOAJ
0.3000 mg | Freq: Once | INTRAMUSCULAR | Status: AC
  Administered 2016-11-02: 0.3 mg via INTRAMUSCULAR
  Filled 2016-11-02: qty 0.3

## 2016-11-02 MED ORDER — DIPHENHYDRAMINE HCL 50 MG/ML IJ SOLN
25.0000 mg | Freq: Once | INTRAMUSCULAR | Status: AC
  Administered 2016-11-02: 25 mg via INTRAVENOUS
  Filled 2016-11-02: qty 1

## 2016-11-02 MED ORDER — PREDNISONE 20 MG PO TABS: 40.0000 mg | ORAL_TABLET | Freq: Every day | ORAL | 0 refills | Status: AC

## 2016-11-02 MED ORDER — EPINEPHRINE 0.15 MG/0.15ML IJ SOAJ: 0.1500 mg | INTRAMUSCULAR | 0 refills | Status: DC | PRN

## 2016-11-02 MED ORDER — FAMOTIDINE IN NACL 20-0.9 MG/50ML-% IV SOLN
20.0000 mg | Freq: Once | INTRAVENOUS | Status: AC
  Administered 2016-11-02: 20 mg via INTRAVENOUS
  Filled 2016-11-02: qty 50

## 2016-11-02 NOTE — ED Triage Notes (Signed)
Pt to ED via POV with swelling of the top and bottom lip. Pt states that the swelling started yesterday in the bottom lip, today the top lip started swelling also. Pt denies being on Lisinopril. Denies shortness of breath. No tongue swelling noted. Pt denies feeling of throat closing up.

## 2016-11-02 NOTE — ED Provider Notes (Signed)
[facility] Emergency Department Provider Note    First MD Initiated Contact with Patient 11/02/16 1509     (approximate)  I have reviewed the triage vital signs and the nursing notes.   HISTORY  Chief Complaint Angioedema    HPI Aaron Woodard is a 46 y.o. male presents from with swelling of his lips that he thinks started yesterday. Has had history of the same in the past. Not on any antihypertensive medications. Does have family history of similar symptoms. Does not have any shortness of breath or trouble swallowing. States the previous episodes spontaneously resolved. Denies any itching or rash. No bug bites. No recent fevers.   Past Medical History:  Diagnosis Date  . Acute appendicitis with localized peritonitis   . Appendicitis, acute 10/04/2016   Family History  Problem Relation Age of Onset  . Diabetes Mother   . Hypertension Mother   . Heart disease Mother   . Healthy Father    Past Surgical History:  Procedure Laterality Date  . LAPAROSCOPIC APPENDECTOMY N/A 10/04/2016   Procedure: APPENDECTOMY LAPAROSCOPIC;  Surgeon: Florene Glen, MD;  Location: [facility] ORS;  Service: General;  Laterality: N/A;   Patient Active Problem List   Diagnosis Date Noted  . Appendicitis, acute 10/04/2016  . Acute appendicitis with localized peritonitis       Prior to Admission medications   Medication Sig Start Date End Date Taking? Authorizing Provider  traMADol (ULTRAM) 50 MG tablet Take 1 tablet (50 mg total) by mouth every 6 (six) hours as needed. 10/16/16   Clayburn Pert, MD    Allergies Patient has no known allergies.    Social History Social History  Substance Use Topics  . Smoking status: Current Every Day Smoker    Packs/day: 0.50    Types: Cigarettes  . Smokeless tobacco: Never Used  . Alcohol use 6.0 - 8.4 oz/week    10 - 14 Standard drinks or equivalent per week     Comment: 2-3 Beers Daily    Review of Systems Patient  denies headaches, rhinorrhea, blurry vision, numbness, shortness of breath, chest pain, edema, cough, abdominal pain, nausea, vomiting, diarrhea, dysuria, fevers, rashes or hallucinations unless otherwise stated above in HPI. ____________________________________________   PHYSICAL EXAM:  VITAL SIGNS: Vitals:   11/02/16 1453  BP: (!) 161/95  Pulse: (!) 102  Resp: 16  Temp: 98.8 F (37.1 C)    Constitutional: Alert and oriented. Well appearing and in no acute distress. Eyes: Conjunctivae are normal.  Head: Atraumatic. Nose: No congestion/rhinnorhea. Mouth/Throat: Mucous membranes are moist.  Mild angioedema to righ upper lip, no tongue or uvular edema Neck: No stridor. Painless ROM.  Cardiovascular: Normal rate, regular rhythm. Grossly normal heart sounds.  Good peripheral circulation. Respiratory: Normal respiratory effort.  No retractions. Lungs CTAB. Gastrointestinal: Soft and nontender. No distention. No abdominal bruits. No CVA tenderness. Genitourinary:  Musculoskeletal: No lower extremity tenderness nor edema.  No joint effusions. Neurologic:  Normal speech and language. No gross focal neurologic deficits are appreciated. No facial droop Skin:  Skin is warm, dry and intact. No rash noted. Psychiatric: Mood and affect are normal. Speech and behavior are normal.  ____________________________________________   LABS (all labs ordered are listed, but only abnormal results are displayed)  No results found for this or any previous visit (from the past 24 hour(s)). ____________________________________________ ____________________________________________   PROCEDURES  Procedure(s) performed:  Procedures    Critical Care performed: no ____________________________________________   INITIAL IMPRESSION / ASSESSMENT AND  PLAN / ED COURSE  Pertinent labs & imaging results that were available during my care of the patient were reviewed by me and considered in my medical  decision making (see chart for details).  DDX: angioedema, anaphylaxis, allergic reaction  Aaron Woodard is a 46 y.o. who presents to the ED with angioedema of the lip.  Patient is AFVSS in ED. Exam as above. Given current presentation have considered the above differential.  Patient otherwise well appearing. Does not have any upper airway involvement. Will give steroids, Benadryl and is at the as he states he was eating some foods and could be having a component of allergic reaction. We'll continue to monitor.  Clinical Course as of Nov 03 1846  Nancy Fetter Nov 02, 2016  1600 No significant worsening. Will continue to monitor.  [PR]  2518 Patient reassessed and has had improvement in the swelling. This point he feel patient is stable for discharge home. Will be given referral for allergist.  [PR]    Clinical Course User Index [PR] Merlyn Lot, MD     ____________________________________________   FINAL CLINICAL IMPRESSION(S) / ED DIAGNOSES  Final diagnoses:  Angioedema of lips, initial encounter      NEW MEDICATIONS STARTED DURING THIS VISIT:  New Prescriptions   No medications on file     Note:  This document was prepared using Dragon voice recognition software and may include unintentional dictation errors.    Merlyn Lot, MD 11/02/16 1850

## 2016-11-10 ENCOUNTER — Observation Stay
Admission: EM | Admit: 2016-11-10 | Discharge: 2016-11-11 | Disposition: A | Discharge status: Home / Self Care | Payer: Managed Care, Other (non HMO) | Attending: Internal Medicine | Admitting: Internal Medicine

## 2016-11-10 DIAGNOSIS — F1010 Alcohol abuse, uncomplicated: Secondary | ICD-10-CM | POA: Insufficient documentation

## 2016-11-10 DIAGNOSIS — R739 Hyperglycemia, unspecified: Secondary | ICD-10-CM | POA: Diagnosis not present

## 2016-11-10 DIAGNOSIS — T783XXA Angioneurotic edema, initial encounter: Principal | ICD-10-CM | POA: Diagnosis present

## 2016-11-10 DIAGNOSIS — X58XXXA Exposure to other specified factors, initial encounter: Secondary | ICD-10-CM | POA: Insufficient documentation

## 2016-11-10 DIAGNOSIS — T783XXD Angioneurotic edema, subsequent encounter: Secondary | ICD-10-CM

## 2016-11-10 DIAGNOSIS — F17210 Nicotine dependence, cigarettes, uncomplicated: Secondary | ICD-10-CM | POA: Insufficient documentation

## 2016-11-10 DIAGNOSIS — R220 Localized swelling, mass and lump, head: Secondary | ICD-10-CM | POA: Diagnosis present

## 2016-11-10 LAB — CBC WITH DIFFERENTIAL/PLATELET
BASOS ABS: 0.1 10*3/uL (ref 0–0.1)
BASOS PCT: 0 %
Eosinophils Absolute: 0.1 10*3/uL (ref 0–0.7)
Eosinophils Relative: 1 %
HEMATOCRIT: 40 % (ref 40.0–52.0)
HEMOGLOBIN: 14 g/dL (ref 13.0–18.0)
LYMPHS PCT: 15 %
Lymphs Abs: 2.3 10*3/uL (ref 1.0–3.6)
MCH: 26.1 pg (ref 26.0–34.0)
MCHC: 35.1 g/dL (ref 32.0–36.0)
MCV: 74.5 fL — AB (ref 80.0–100.0)
Monocytes Absolute: 0.4 10*3/uL (ref 0.2–1.0)
Monocytes Relative: 3 %
NEUTROS ABS: 12.4 10*3/uL — AB (ref 1.4–6.5)
NEUTROS PCT: 81 %
Platelets: 240 10*3/uL (ref 150–440)
RBC: 5.37 MIL/uL (ref 4.40–5.90)
RDW: 16.5 % — ABNORMAL HIGH (ref 11.5–14.5)
WBC: 15.3 10*3/uL — AB (ref 3.8–10.6)

## 2016-11-10 LAB — COMPREHENSIVE METABOLIC PANEL
ALBUMIN: 3.7 g/dL (ref 3.5–5.0)
ALT: 27 U/L (ref 17–63)
AST: 31 U/L (ref 15–41)
Alkaline Phosphatase: 54 U/L (ref 38–126)
Anion gap: 7 (ref 5–15)
BILIRUBIN TOTAL: 0.5 mg/dL (ref 0.3–1.2)
BUN: 15 mg/dL (ref 6–20)
CHLORIDE: 105 mmol/L (ref 101–111)
CO2: 26 mmol/L (ref 22–32)
CREATININE: 0.89 mg/dL (ref 0.61–1.24)
Calcium: 8.7 mg/dL — ABNORMAL LOW (ref 8.9–10.3)
GFR calc Af Amer: >60 mL/min (ref >60)
GFR calc non Af Amer: >60 mL/min (ref >60)
GLUCOSE: 130 mg/dL — AB (ref 65–99)
POTASSIUM: 4 mmol/L (ref 3.5–5.1)
Sodium: 138 mmol/L (ref 135–145)
TOTAL PROTEIN: 7.8 g/dL (ref 6.5–8.1)

## 2016-11-10 MED ORDER — METHYLPREDNISOLONE SODIUM SUCC 125 MG IJ SOLR
125.0000 mg | Freq: Once | INTRAMUSCULAR | Status: AC
  Administered 2016-11-10: 125 mg via INTRAVENOUS
  Filled 2016-11-10: qty 2

## 2016-11-10 MED ORDER — VITAMIN B-1 100 MG PO TABS
100.0000 mg | ORAL_TABLET | Freq: Every day | ORAL | Status: DC
  Administered 2016-11-10: 100 mg via ORAL
  Filled 2016-11-10: qty 1

## 2016-11-10 MED ORDER — SODIUM CHLORIDE 0.9 % IV SOLN
INTRAVENOUS | Status: DC
  Administered 2016-11-10: 15:00:00 via INTRAVENOUS

## 2016-11-10 MED ORDER — FOLIC ACID 1 MG PO TABS
1.0000 mg | ORAL_TABLET | Freq: Every day | ORAL | Status: DC
  Administered 2016-11-10: 1 mg via ORAL
  Filled 2016-11-10: qty 1

## 2016-11-10 MED ORDER — FAMOTIDINE IN NACL 20-0.9 MG/50ML-% IV SOLN
20.0000 mg | Freq: Two times a day (BID) | INTRAVENOUS | Status: DC
  Administered 2016-11-10 (×2): 20 mg via INTRAVENOUS
  Filled 2016-11-10 (×4): qty 50

## 2016-11-10 MED ORDER — ADULT MULTIVITAMIN W/MINERALS CH
1.0000 | ORAL_TABLET | Freq: Every day | ORAL | Status: DC
  Administered 2016-11-10: 1 via ORAL
  Filled 2016-11-10: qty 1

## 2016-11-10 MED ORDER — ACETAMINOPHEN 325 MG PO TABS: 650.0000 mg | ORAL_TABLET | Freq: Four times a day (QID) | ORAL | Status: DC | PRN

## 2016-11-10 MED ORDER — SENNOSIDES-DOCUSATE SODIUM 8.6-50 MG PO TABS: 1.0000 | ORAL_TABLET | Freq: Every evening | ORAL | Status: DC | PRN

## 2016-11-10 MED ORDER — LORAZEPAM 2 MG PO TABS
0.0000 mg | ORAL_TABLET | Freq: Four times a day (QID) | ORAL | Status: DC
Start: 2016-11-10 — End: 2016-11-11

## 2016-11-10 MED ORDER — FAMOTIDINE IN NACL 20-0.9 MG/50ML-% IV SOLN
20.0000 mg | Freq: Once | INTRAVENOUS | Status: AC
  Administered 2016-11-10: 20 mg via INTRAVENOUS
  Filled 2016-11-10: qty 50

## 2016-11-10 MED ORDER — LORAZEPAM 2 MG PO TABS: 0.0000 mg | ORAL_TABLET | Freq: Two times a day (BID) | ORAL | Status: DC

## 2016-11-10 MED ORDER — NICOTINE 21 MG/24HR TD PT24
21.0000 mg | MEDICATED_PATCH | Freq: Every day | TRANSDERMAL | Status: DC
  Administered 2016-11-10: 21 mg via TRANSDERMAL
  Filled 2016-11-10: qty 1

## 2016-11-10 MED ORDER — THIAMINE HCL 100 MG/ML IJ SOLN: 100.0000 mg | Freq: Every day | INTRAMUSCULAR | Status: DC

## 2016-11-10 MED ORDER — LORAZEPAM 2 MG/ML IJ SOLN: 1.0000 mg | Freq: Four times a day (QID) | INTRAMUSCULAR | Status: DC | PRN

## 2016-11-10 MED ORDER — EPINEPHRINE 0.3 MG/0.3ML IJ SOAJ
0.3000 mg | INTRAMUSCULAR | Status: DC | PRN
  Filled 2016-11-10: qty 0.3

## 2016-11-10 MED ORDER — SODIUM CHLORIDE 0.9 % IV SOLN
Freq: Once | INTRAVENOUS | Status: AC
  Administered 2016-11-10: 11:00:00 via INTRAVENOUS

## 2016-11-10 MED ORDER — ACETAMINOPHEN 650 MG RE SUPP: 650.0000 mg | Freq: Four times a day (QID) | RECTAL | Status: DC | PRN

## 2016-11-10 MED ORDER — ENOXAPARIN SODIUM 40 MG/0.4ML ~~LOC~~ SOLN
40.0000 mg | SUBCUTANEOUS | Status: DC
  Administered 2016-11-10: 40 mg via SUBCUTANEOUS
  Filled 2016-11-10: qty 0.4

## 2016-11-10 MED ORDER — METHYLPREDNISOLONE SODIUM SUCC 40 MG IJ SOLR
40.0000 mg | Freq: Two times a day (BID) | INTRAMUSCULAR | Status: DC
  Administered 2016-11-10 (×2): 40 mg via INTRAVENOUS
  Filled 2016-11-10 (×2): qty 1

## 2016-11-10 MED ORDER — DIPHENHYDRAMINE HCL 50 MG/ML IJ SOLN
25.0000 mg | Freq: Once | INTRAMUSCULAR | Status: AC
  Administered 2016-11-10: 25 mg via INTRAVENOUS
  Filled 2016-11-10: qty 1

## 2016-11-10 MED ORDER — DIPHENHYDRAMINE HCL 50 MG/ML IJ SOLN
12.5000 mg | Freq: Four times a day (QID) | INTRAMUSCULAR | Status: DC | PRN
Start: 2016-11-10 — End: 2016-11-11

## 2016-11-10 MED ORDER — LORAZEPAM 1 MG PO TABS: 1.0000 mg | ORAL_TABLET | Freq: Four times a day (QID) | ORAL | Status: DC | PRN

## 2016-11-10 NOTE — Progress Notes (Signed)
Admission Note:  Pt admitted to room 146, pt alert and oriented X4. No complaints of pain at this time. Skin assessment completed with Threasa Beards RN. Call bell in reach. Bed in lowest position.

## 2016-11-10 NOTE — Evaluation (Signed)
Clinical/Bedside Swallow Evaluation Patient Details  Name: Aaron Woodard MRN: [id number] Date of Birth: [birth year]  Today's Date: 11/10/2016 Time: SLP Start Time (ACUTE ONLY): 1400 SLP Stop Time (ACUTE ONLY): 1445 SLP Time Calculation (min) (ACUTE ONLY): 45 min  Past Medical History:  Past Medical History:  Diagnosis Date  . Acute appendicitis with localized peritonitis   . Appendicitis, acute 10/04/2016   Past Surgical History:  Past Surgical History:  Procedure Laterality Date  . LAPAROSCOPIC APPENDECTOMY N/A 10/04/2016   Procedure: APPENDECTOMY LAPAROSCOPIC;  Surgeon: Florene Glen, MD;  Location: [facility] ORS;  Service: General;  Laterality: N/A;   HPI:      Assessment / Plan / Recommendation Clinical Impression  Pt appears to present w/ no apparent oropharyngeal phase dysphagia and appears at reduced risk for aspiration following general aspiration precautions. No oral or pharyngeal phase deficits were noted during bolus management and swallowing. Pt fed self. Pt stated his tongue felt "back to normal really". He endorsed 2-3 other episodes that is tongue or lips have swollen or become tingly. He has an appt w/ the Allergist for testing tomorrow per his report.  SLP Visit Diagnosis: Dysphagia, unspecified (R13.10)    Aspiration Risk   (reduced )    Diet Recommendation  Regular diet w/ general aspiration precautions; reflux precautions  Medication Administration: Whole meds with liquid    Other  Recommendations Recommended Consults:  (n/a) Oral Care Recommendations: Oral care BID;Patient independent with oral care   Follow up Recommendations None      Frequency and Duration            Prognosis Prognosis for Safe Diet Advancement: Good Barriers to Reach Goals:  (none)      Swallow Study   General Date of Onset: 11/10/16 Type of Study: Bedside Swallow Evaluation Previous Swallow Assessment: none Diet Prior to this Study: Regular;Thin liquids (at  home) Temperature Spikes Noted: No (wbc not elevated) Respiratory Status: Room air History of Recent Intubation: No Behavior/Cognition: Alert;Cooperative;Pleasant mood Oral Cavity Assessment: Within Functional Limits (no obvious raised areas) Oral Care Completed by SLP: Recent completion by staff Oral Cavity - Dentition: Adequate natural dentition Vision: Functional for self-feeding Self-Feeding Abilities: Able to feed self Patient Positioning: Upright in bed Baseline Vocal Quality: Normal Volitional Cough: Strong Volitional Swallow: Able to elicit    Oral/Motor/Sensory Function Overall Oral Motor/Sensory Function: Within functional limits (min fasciculations of the tongue during protrusion)   Ice Chips Ice chips: Within functional limits Presentation: Spoon (fed; 2 trials)   Thin Liquid Thin Liquid: Within functional limits Presentation: Cup;Self Fed (~4 ozs)    Nectar Thick Nectar Thick Liquid: Not tested   Honey Thick Honey Thick Liquid: Not tested   Puree Puree: Within functional limits Presentation: Self Fed;Spoon (4 ozs)   Solid   GO   Solid: Within functional limits Presentation: Self Fed (4 graham crackers) Other Comments: pt stated he felt a small area on the back of his tongue that was "funny feeling sort of" but this did not impact his oral phase of swallowing or bolus management    Functional Assessment Tool Used: clinical judgement Functional Limitations: Swallowing Swallow Current Status (V9563): At least 1 percent but less than 20 percent impaired, limited or restricted Swallow Goal Status [phone]): At least 1 percent but less than 20 percent impaired, limited or restricted Swallow Discharge Status [phone]): At least 1 percent but less than 20 percent impaired, limited or restricted    Orinda Kenner, MS, CCC-SLP Aaron Woodard 11/10/2016,2:54 PM

## 2016-11-10 NOTE — ED Provider Notes (Signed)
[facility] Emergency Department Provider Note       Time seen: ----------------------------------------- 9:14 AM on 11/10/2016 -----------------------------------------     I have reviewed the triage vital signs and the nursing notes.   HISTORY   Chief Complaint Allergic Reaction    HPI Aaron Woodard is a 46 y.o. male who presents to the ED for tongue swelling. Patient reports upon awakening today he noticed his tongue was slightly swollen on the left side and has progressed throughout the morning to almost diffuse swelling with swelling in the floor of his mouth. Patient went to a primary care doctor office and he gave himself an EpiPen he was given a second dose of epinephrine and Solu-Medrol. EMS is given Benadryl 50 mg intravenously. He presents with swelling of his tongue and below his jaw, has not had any difficulty breathing. He was seen here about a week ago for same and reports a history of this happening once before. His mother also has had this happen in the past.   Past Medical History:  Diagnosis Date  . Acute appendicitis with localized peritonitis   . Appendicitis, acute 10/04/2016    Patient Active Problem List   Diagnosis Date Noted  . Appendicitis, acute 10/04/2016  . Acute appendicitis with localized peritonitis     Past Surgical History:  Procedure Laterality Date  . LAPAROSCOPIC APPENDECTOMY N/A 10/04/2016   Procedure: APPENDECTOMY LAPAROSCOPIC;  Surgeon: Florene Glen, MD;  Location: [facility] ORS;  Service: General;  Laterality: N/A;    Allergies Patient has no known allergies.  Social History Social History  Substance Use Topics  . Smoking status: Current Every Day Smoker    Packs/day: 0.50    Types: Cigarettes  . Smokeless tobacco: Never Used  . Alcohol use 6.0 - 8.4 oz/week    10 - 14 Standard drinks or equivalent per week     Comment: 2-3 Beers Daily    Review of Systems Constitutional: Negative for  fever. Eyes: Negative for vision changes ENT:  Positive for tongue and mouth swelling Cardiovascular: Negative for chest pain. Respiratory: Negative for shortness of breath. Gastrointestinal: Negative for abdominal pain, vomiting and diarrhea. Genitourinary: Negative for dysuria. Musculoskeletal: Negative for back pain. Skin: Negative for rash. Neurological: Negative for headaches, focal weakness or numbness.  All systems negative/normal/unremarkable except as stated in the HPI  ____________________________________________   PHYSICAL EXAM:  VITAL SIGNS: ED Triage Vitals  Enc Vitals Group     BP 11/10/16 0907 (!) 147/81     Pulse Rate 11/10/16 0907 75     Resp 11/10/16 0907 16     Temp 11/10/16 0907 98 F (36.7 C)     Temp Source 11/10/16 0907 Oral     SpO2 11/10/16 0907 99 %     Weight 11/10/16 0910 220 lb (99.8 kg)     Height 11/10/16 0910 5\' 10"  (1.778 m)     Head Circumference --      Peak Flow --      Pain Score 11/10/16 0907 0     Pain Loc --      Pain Edu? --      Excl. in [place]? --     Constitutional: Alert and oriented. Well appearing and in no distress. Eyes: Conjunctivae are normal. Normal extraocular movements. ENT   Head: Normocephalic and atraumatic.   Nose: No congestion/rhinnorhea.   Mouth/Throat: Edema of the tongue is appreciated, particularly on the left, for the mouth is edematous with anterior neck swelling.  Somewhat muffled sound to his voice.   Neck: No stridor. Cardiovascular: Normal rate, regular rhythm. No murmurs, rubs, or gallops. Respiratory: Normal respiratory effort without tachypnea nor retractions. Breath sounds are clear and equal bilaterally. No wheezes/rales/rhonchi. Gastrointestinal: Soft and nontender. Normal bowel sounds Musculoskeletal: Nontender with normal range of motion in extremities. No lower extremity tenderness nor edema. Neurologic:  No gross focal neurologic deficits are appreciated.  Skin:  Skin is warm, dry  and intact. No rash noted. Psychiatric: Mood and affect are normal. Behavior is normal ____________________________________________  ED COURSE:  Pertinent labs & imaging results that were available during my care of the patient were reviewed by me and considered in my medical decision making (see chart for details). Patient presents for angioedema, we will assess with labs, continue IV fluids and antihistamines and consider C1 inhibitor concentrate   Procedures ____________________________________________   LABS (pertinent positives/negatives)  Labs Reviewed  CBC WITH DIFFERENTIAL/PLATELET - Abnormal; Notable for the following:       Result Value   WBC 15.3 (*)    MCV 74.5 (*)    RDW 16.5 (*)    Neutro Abs 12.4 (*)    All other components within normal limits  COMPREHENSIVE METABOLIC PANEL - Abnormal; Notable for the following:    Glucose, Bld 130 (*)    Calcium 8.7 (*)    All other components within normal limits  CRITICAL CARE Performed by: Earleen Newport   Total critical care time: 30 minutes  Critical care time was exclusive of separately billable procedures and treating other patients.  Critical care was necessary to treat or prevent imminent or life-threatening deterioration.  Critical care was time spent personally by me on the following activities: development of treatment plan with patient and/or surrogate as well as nursing, discussions with consultants, evaluation of patient's response to treatment, examination of patient, obtaining history from patient or surrogate, ordering and performing treatments and interventions, ordering and review of laboratory studies, ordering and review of radiographic studies, pulse oximetry and re-evaluation of patient's condition.  ____________________________________________  FINAL ASSESSMENT AND PLAN  Angioedema  Plan: Patient's labs were dictated above. Patient had presented for Angina edema which has not improved after  2 doses of epinephrine, site Medrol, Benadryl and fluids. I have discussed with the hospitalist for observation.   Earleen Newport, MD   Note: This note was generated in part or whole with voice recognition software. Voice recognition is usually quite accurate but there are transcription errors that can and very often do occur. I apologize for any typographical errors that were not detected and corrected.     Earleen Newport, MD 11/10/16 [phone]

## 2016-11-10 NOTE — ED Notes (Signed)
Hooked patient up to monitor. 

## 2016-11-10 NOTE — ED Triage Notes (Signed)
Pt c/o waking with a spot on the left side of tongue slightly swollen , states throughout the morning the swelling progressed and he went to [facility] [facility] and gave himself epi pen and there was given second epi and solumedrol. EMS gave pt benadryl 50mg .. Pt has swelling of tongue and below jaw. Denies any difficulty breathing at this time.. Was recently seen here for similar sx with swelling of the lips. No known cause.Aaron Woodard

## 2016-11-10 NOTE — ED Notes (Signed)
Pt states he is having some decrease of the swelling of the tongue, not feeling as anxious.. VSS , will continue to monitor the pt.

## 2016-11-10 NOTE — H&P (Signed)
[facility] NAME: Aaron Woodard    MR#:  [id number]  DATE OF BIRTH:  [birth year]  DATE OF ADMISSION:  11/10/2016  PRIMARY CARE PHYSICIAN: Patient, No Pcp Per   REQUESTING/REFERRING PHYSICIAN: Dr. Jimmye Norman  CHIEF COMPLAINT:   Tongue swelling HISTORY OF PRESENT ILLNESS:  Aaron Woodard  is a 46 y.o. male with a known history of Tobacco and EtOH abuse who isn't in a week ago for angioedema of lips which has subsequently resolved however today went to [place] [facility] for swelling of his tongue. He took EpiPen 15 minutes after he noticed the tongue was swollen without much improvement. He was evaluated there and came to the ER for further evaluation. In the emergency room he is received Solu-Medrol as well as Epic Neff friend and Benadryl. He continues have tongue swelling however reports no problems breathing. He also states about 3 months ago he had a similar issue. He has an appointment tomorrow with an allergist.  PAST MEDICAL HISTORY:   Past Medical History:  Diagnosis Date  . Acute appendicitis with localized peritonitis   . Appendicitis, acute 10/04/2016    PAST SURGICAL HISTORY:   Past Surgical History:  Procedure Laterality Date  . LAPAROSCOPIC APPENDECTOMY N/A 10/04/2016   Procedure: APPENDECTOMY LAPAROSCOPIC;  Surgeon: Florene Glen, MD;  Location: [facility] ORS;  Service: General;  Laterality: N/A;    SOCIAL HISTORY:   Social History  Substance Use Topics  . Smoking status: Current Every Day Smoker    Packs/day: 1    Types: Cigarettes  . Smokeless tobacco: Never Used  . Alcohol use 6.0 - 8.4 oz/week    10 - 14 Standard drinks or equivalent per week     Comment: 2-3 Beers Daily    FAMILY HISTORY:   Family History  Problem Relation Age of Onset  . Diabetes Mother   . Hypertension Mother   . Heart disease Mother   . Healthy Father     DRUG ALLERGIES:  No Known Allergies  REVIEW OF SYSTEMS:    Review of Systems  Constitutional: Negative.  Negative for chills, fever and malaise/fatigue.  HENT: Negative.  Negative for ear discharge, ear pain, hearing loss, nosebleeds and sore throat.        Tongue swelling  Eyes: Negative.  Negative for blurred vision and pain.  Respiratory: Negative.  Negative for cough, hemoptysis, shortness of breath and wheezing.   Cardiovascular: Negative.  Negative for chest pain, palpitations and leg swelling.  Gastrointestinal: Negative.  Negative for abdominal pain, blood in stool, diarrhea, nausea and vomiting.  Genitourinary: Negative.  Negative for dysuria.  Musculoskeletal: Negative.  Negative for back pain.  Skin: Negative.   Neurological: Negative for dizziness, tremors, speech change, focal weakness, seizures and headaches.  Endo/Heme/Allergies: Negative.  Does not bruise/bleed easily.  Psychiatric/Behavioral: Negative.  Negative for depression, hallucinations and suicidal ideas.    MEDICATIONS AT HOME:   Prior to Admission medications   Medication Sig Start Date End Date Taking? Authorizing Provider  EPINEPHrine 0.15 MG/0.15ML IJ injection Inject 0.15 mLs (0.15 mg total) into the muscle as needed for anaphylaxis. 11/02/16   Merlyn Lot, MD  traMADol (ULTRAM) 50 MG tablet Take 1 tablet (50 mg total) by mouth every 6 (six) hours as needed. 10/16/16   Clayburn Pert, MD      VITAL SIGNS:  Blood pressure 119/75, pulse 68, temperature 98 F (36.7 C), temperature source Oral, resp. rate 16, height 5\' 10"  (  1.778 m), weight 99.8 kg (220 lb), SpO2 99 %.  PHYSICAL EXAMINATION:   Physical Exam  Constitutional: He is oriented to person, place, and time and well-developed, well-nourished, and in no distress. No distress.  HENT:  Head: Normocephalic.  Extensive tongue swelling  Eyes: No scleral icterus.  Neck: Normal range of motion. Neck supple. No JVD present. No tracheal deviation present.  Cardiovascular: Normal rate, regular rhythm and  normal heart sounds.  Exam reveals no gallop and no friction rub.   No murmur heard. Pulmonary/Chest: Effort normal and breath sounds normal. No respiratory distress. He has no wheezes. He has no rales. He exhibits no tenderness.  Abdominal: Soft. Bowel sounds are normal. He exhibits no distension and no mass. There is no tenderness. There is no rebound and no guarding.  Musculoskeletal: Normal range of motion. He exhibits no edema.  Neurological: He is alert and oriented to person, place, and time.  Skin: Skin is warm. No rash noted. No erythema.  Psychiatric: Affect and judgment normal.      LABORATORY PANEL:   CBC  Recent Labs Lab 11/10/16 1038  WBC 15.3*  HGB 14.0  HCT 40.0  PLT 240   ------------------------------------------------------------------------------------------------------------------  Chemistries   Recent Labs Lab 11/10/16 1038  NA 138  K 4.0  CL 105  CO2 26  GLUCOSE 130*  BUN 15  CREATININE 0.89  CALCIUM 8.7*  AST 31  ALT 27  ALKPHOS 54  BILITOT 0.5   ------------------------------------------------------------------------------------------------------------------  Cardiac Enzymes No results for input(s): TROPONINI in the last 168 hours. ------------------------------------------------------------------------------------------------------------------  RADIOLOGY:  No results found.  EKG:  No orders found for this or any previous visit.  IMPRESSION AND PLAN:   46 year old male with past medical history significant for tobacco and EtOH abuse who presents with angioedema of tongue.  1. Angioedema of tongue with unclear etiology Continue IV steroids, Benadryl, Pepcid If patient's symptoms improve and patient has an allergist appointment tomorrow. If patient's symptoms worsen then we'll need ENT evaluation. Speech consultation  2. Hyperglycemia: Repeat BMP and consider checking her A1c tomorrow if the closest still elevated.  3. EtOH  abuse: Patient counseled to decrease Etoh use CIWA protocol ordered.  4. Tobacco dependence: Patient is encouraged to quit smoking. Counseling was provided for 4 minutes.    All the records are reviewed and case discussed with ED provider. Management plans discussed with the patient and he is in agreement  CODE STATUS: full  TOTAL TIME TAKING CARE OF THIS PATIENT: 45 minutes.    Veatrice Eckstein M.D on 11/10/2016 at 11:30 AM  Between 7am to 6pm - Pager - [phone]  After 6pm go to [email or web address] - password [email or web address] [facility]  Office  [phone]  CC: Primary care physician; Patient, No Pcp Per

## 2016-11-10 NOTE — ED Notes (Signed)
Attempted to call report , pt is currently in an isolation room and is to call this back in a few minutes when available.

## 2016-11-11 LAB — CBC
HEMATOCRIT: 38.6 % — AB (ref 40.0–52.0)
Hemoglobin: 13.5 g/dL (ref 13.0–18.0)
MCH: 26.4 pg (ref 26.0–34.0)
MCHC: 35 g/dL (ref 32.0–36.0)
MCV: 75.3 fL — AB (ref 80.0–100.0)
Platelets: 210 10*3/uL (ref 150–440)
RBC: 5.12 MIL/uL (ref 4.40–5.90)
RDW: 17.2 % — ABNORMAL HIGH (ref 11.5–14.5)
WBC: 11.2 10*3/uL — ABNORMAL HIGH (ref 3.8–10.6)

## 2016-11-11 LAB — BASIC METABOLIC PANEL
Anion gap: 6 (ref 5–15)
BUN: 13 mg/dL (ref 6–20)
CO2: 24 mmol/L (ref 22–32)
Calcium: 8.3 mg/dL — ABNORMAL LOW (ref 8.9–10.3)
Chloride: 106 mmol/L (ref 101–111)
Creatinine, Ser: 0.89 mg/dL (ref 0.61–1.24)
GFR calc Af Amer: >60 mL/min (ref >60)
GLUCOSE: 149 mg/dL — AB (ref 65–99)
POTASSIUM: 4 mmol/L (ref 3.5–5.1)
Sodium: 136 mmol/L (ref 135–145)

## 2016-11-11 MED ORDER — DIPHENHYDRAMINE HCL 25 MG PO CAPS: 25.0000 mg | ORAL_CAPSULE | Freq: Four times a day (QID) | ORAL | 2 refills | Status: DC | PRN

## 2016-11-11 MED ORDER — RANITIDINE HCL 150 MG PO TABS: 150.0000 mg | ORAL_TABLET | Freq: Every day | ORAL | 1 refills | Status: DC

## 2016-11-11 MED ORDER — PREDNISONE 10 MG (21) PO TBPK: ORAL_TABLET | ORAL | 0 refills | Status: DC

## 2016-11-11 NOTE — Progress Notes (Signed)
DISCHARGE NOTE:  Pt given discharge instructions and work note. Pt notified that prescriptions were sent to pharmacy. Pt verbalized understanding.

## 2016-11-11 NOTE — Progress Notes (Signed)
     Aaron Woodard was admitted to the Hospital on 11/10/2016 and Discharged  11/11/2016 and should be excused from work/school   for  Two days starting 11/10/2016 , may return to work/school without any restrictions.    Epifanio Lesches M.D on 11/11/2016,at 9:18 AM

## 2016-11-16 NOTE — Discharge Summary (Signed)
Aaron Woodard, is a 46 y.o. male  DOB 24-Oct-1970  MRN [id number].  Admission date:  11/10/2016  Admitting Physician  Bettey Costa, MD  Discharge Date:  11/11/2016   Primary MD  Patient, No Pcp Per  Recommendations for primary care physician for things to follow:   He as appointment with Allergists today,   Admission Diagnosis  Angioedema, subsequent encounter [T78.3XXD]   Discharge Diagnosis  Angioedema, subsequent encounter [T78.3XXD]    Active Problems:   Angioedema      Past Medical History:  Diagnosis Date  . Acute appendicitis with localized peritonitis   . Appendicitis, acute 10/04/2016    Past Surgical History:  Procedure Laterality Date  . LAPAROSCOPIC APPENDECTOMY N/A 10/04/2016   Procedure: APPENDECTOMY LAPAROSCOPIC;  Surgeon: Florene Glen, MD;  Location: [facility] ORS;  Service: General;  Laterality: N/A;       History of present illness and  Hospital Course:     Kindly see H&P for history of present illness and admission details, please review complete Labs, Consult reports and Test reports for all details in brief  HPI  from the history and physical done on the day of admission  46 year old L patient came in because of tongue swelling started after eating dinner last night. Admitted for anaphylaxis.  Hospital Course   1 angioedema of uncertain origin. Patient received EpiPen immediately, admitted to hospitalist service, started on Solumedrol did not have any rash on the have only tongue swelling without the airway  compromise. With Solu-Medrol and Benadryl patient symptoms nicely improved, tongue swelling decreased, he wanted to go home on 26 morning because he has appointment with allergy and 26th afternoon. Discharge home with prednisone Dosepak.    Discharge Condition: stable   Follow  UP Follow with allergists today.     Discharge Instructions  and  Discharge Medications      Allergies as of 11/11/2016   No Known Allergies     Medication List    TAKE these medications   diphenhydrAMINE 25 mg capsule Commonly known as:  BENADRYL Take 1 capsule (25 mg total) by mouth every 6 (six) hours as needed for itching or allergies.   EPINEPHrine 0.15 MG/0.15ML injection Commonly known as:  EPIPEN JR Inject 0.15 mLs (0.15 mg total) into the muscle as needed for anaphylaxis.   predniSONE 10 MG (21) Tbpk tablet Commonly known as:  STERAPRED UNI-PAK 21 TAB Taper by 10 mg daily   ranitidine 150 MG tablet Commonly known as:  ZANTAC Take 1 tablet (150 mg total) by mouth at bedtime.   traMADol 50 MG tablet Commonly known as:  ULTRAM Take 1 tablet (50 mg total) by mouth every 6 (six) hours as needed.         Diet and Activity recommendation: See Discharge Instructions above   Consults obtained - none   Major procedures and Radiology Reports - PLEASE review detailed and final reports for all details, in brief -      No results found.  Micro Results    No results found for this or any previous visit (from the past 240 hour(s)).     Today   Subjective:   [name] today has no Further tongue swelling.  Objective:   Blood pressure (!) 151/82, pulse 65, temperature 98.4 F (36.9 C), temperature source Oral, resp. rate 18, height 5\' 10"  (1.778 m), weight 99.8 kg (220 lb), SpO2 100 %.  No intake or output data in the 24 hours ending 11/16/16 1517  Exam Awake Alert, Oriented x 3, No new F.N deficits, Normal affect [place].AT,PERRAL Supple Neck,No JVD, No cervical lymphadenopathy appriciated.  Symmetrical Chest wall movement, Good air movement bilaterally, CTAB RRR,No Gallops,Rubs or new Murmurs, No Parasternal Heave +ve B.Sounds, Abd Soft, Non tender, No organomegaly appriciated, No rebound -guarding or rigidity. No Cyanosis, Clubbing or edema,  No new Rash or bruise  Data Review   CBC w Diff:  Lab Results  Component Value Date   WBC 11.2 (H) 11/11/2016   HGB 13.5 11/11/2016   HCT 38.6 (L) 11/11/2016   PLT 210 11/11/2016   LYMPHOPCT 15 11/10/2016   MONOPCT 3 11/10/2016   EOSPCT 1 11/10/2016   BASOPCT 0 11/10/2016    CMP:  Lab Results  Component Value Date   NA 136 11/11/2016   K 4.0 11/11/2016   CL 106 11/11/2016   CO2 24 11/11/2016   BUN 13 11/11/2016   CREATININE 0.89 11/11/2016   PROT 7.8 11/10/2016   ALBUMIN 3.7 11/10/2016   BILITOT 0.5 11/10/2016   ALKPHOS 54 11/10/2016   AST 31 11/10/2016   ALT 27 11/10/2016  .   Total Time in preparing paper work, data evaluation and todays exam - 77 minutes  Wendall Isabell M.D on 11/11/2016 at 8:52 AM    Note: This dictation was prepared with Dragon dictation along with smaller phrase technology. Any transcriptional errors that result from this process are unintentional.

## 2019-12-21 ENCOUNTER — Other Ambulatory Visit: Payer: Self-pay

## 2019-12-21 ENCOUNTER — Observation Stay
Admission: EM | Admit: 2019-12-21 | Discharge: 2019-12-21 | Disposition: A | Discharge status: Home / Self Care | Payer: PRIVATE HEALTH INSURANCE | Attending: Internal Medicine | Admitting: Internal Medicine

## 2019-12-21 ENCOUNTER — Encounter: Payer: Self-pay | Admitting: Emergency Medicine

## 2019-12-21 DIAGNOSIS — T783XXA Angioneurotic edema, initial encounter: Secondary | ICD-10-CM | POA: Diagnosis present

## 2019-12-21 DIAGNOSIS — T783XXD Angioneurotic edema, subsequent encounter: Secondary | ICD-10-CM

## 2019-12-21 DIAGNOSIS — Z20822 Contact with and (suspected) exposure to covid-19: Secondary | ICD-10-CM | POA: Insufficient documentation

## 2019-12-21 DIAGNOSIS — F17200 Nicotine dependence, unspecified, uncomplicated: Secondary | ICD-10-CM | POA: Diagnosis not present

## 2019-12-21 DIAGNOSIS — F17210 Nicotine dependence, cigarettes, uncomplicated: Secondary | ICD-10-CM | POA: Diagnosis not present

## 2019-12-21 LAB — BASIC METABOLIC PANEL
Anion gap: 11 (ref 5–15)
BUN: 12 mg/dL (ref 6–20)
CO2: 25 mmol/L (ref 22–32)
Calcium: 8.4 mg/dL — ABNORMAL LOW (ref 8.9–10.3)
Chloride: 102 mmol/L (ref 98–111)
Creatinine, Ser: 0.9 mg/dL (ref 0.61–1.24)
GFR calc Af Amer: >60 mL/min (ref >60)
GFR calc non Af Amer: >60 mL/min (ref >60)
Glucose, Bld: 101 mg/dL — ABNORMAL HIGH (ref 70–99)
Potassium: 3.5 mmol/L (ref 3.5–5.1)
Sodium: 138 mmol/L (ref 135–145)

## 2019-12-21 LAB — CBC
HCT: 38.9 % — ABNORMAL LOW (ref 39.0–52.0)
Hemoglobin: 14 g/dL (ref 13.0–17.0)
MCH: 27.5 pg (ref 26.0–34.0)
MCHC: 36 g/dL (ref 30.0–36.0)
MCV: 76.4 fL — ABNORMAL LOW (ref 80.0–100.0)
Platelets: 243 10*3/uL (ref 150–400)
RBC: 5.09 MIL/uL (ref 4.22–5.81)
RDW: 14.2 % (ref 11.5–15.5)
WBC: 11.5 10*3/uL — ABNORMAL HIGH (ref 4.0–10.5)
nRBC: 0 % (ref 0.0–0.2)

## 2019-12-21 LAB — SARS CORONAVIRUS 2 BY RT PCR (HOSPITAL ORDER, PERFORMED IN ~~LOC~~ HOSPITAL LAB): SARS Coronavirus 2: NEGATIVE

## 2019-12-21 MED ORDER — DIPHENHYDRAMINE HCL 25 MG PO CAPS
25.0000 mg | ORAL_CAPSULE | Freq: Two times a day (BID) | ORAL | Status: DC
  Filled 2019-12-21: qty 1

## 2019-12-21 MED ORDER — METHYLPREDNISOLONE SODIUM SUCC 125 MG IJ SOLR
125.0000 mg | INTRAMUSCULAR | Status: AC
  Administered 2019-12-21: 125 mg via INTRAVENOUS
  Filled 2019-12-21: qty 2

## 2019-12-21 MED ORDER — EPINEPHRINE 0.3 MG/0.3ML IJ SOAJ: 0.3000 mg | INTRAMUSCULAR | Status: DC | PRN

## 2019-12-21 MED ORDER — PREDNISONE 20 MG PO TABS: 20.0000 mg | ORAL_TABLET | Freq: Every day | ORAL | Status: DC

## 2019-12-21 MED ORDER — PREDNISONE 20 MG PO TABS: 40.0000 mg | ORAL_TABLET | Freq: Every day | ORAL | Status: DC

## 2019-12-21 MED ORDER — ONDANSETRON HCL 4 MG/2ML IJ SOLN: 4.0000 mg | Freq: Four times a day (QID) | INTRAMUSCULAR | Status: DC | PRN

## 2019-12-21 MED ORDER — NICOTINE 14 MG/24HR TD PT24
14.0000 mg | MEDICATED_PATCH | Freq: Every day | TRANSDERMAL | Status: DC
  Filled 2019-12-21: qty 1

## 2019-12-21 MED ORDER — SODIUM CHLORIDE 0.9 % IV SOLN: 250.0000 mL | INTRAVENOUS | Status: DC | PRN

## 2019-12-21 MED ORDER — SODIUM CHLORIDE 0.9% FLUSH: 3.0000 mL | Freq: Two times a day (BID) | INTRAVENOUS | Status: DC

## 2019-12-21 MED ORDER — ACETAMINOPHEN 650 MG RE SUPP: 650.0000 mg | Freq: Four times a day (QID) | RECTAL | Status: DC | PRN

## 2019-12-21 MED ORDER — ONDANSETRON HCL 4 MG PO TABS: 4.0000 mg | ORAL_TABLET | Freq: Four times a day (QID) | ORAL | Status: DC | PRN

## 2019-12-21 MED ORDER — ACETAMINOPHEN 325 MG PO TABS: 650.0000 mg | ORAL_TABLET | Freq: Four times a day (QID) | ORAL | Status: DC | PRN

## 2019-12-21 MED ORDER — DIPHENHYDRAMINE HCL 50 MG/ML IJ SOLN
50.0000 mg | Freq: Once | INTRAMUSCULAR | Status: AC
  Administered 2019-12-21: 50 mg via INTRAVENOUS
  Filled 2019-12-21: qty 1

## 2019-12-21 MED ORDER — EPINEPHRINE 0.3 MG/0.3ML IJ SOAJ
0.3000 mg | Freq: Once | INTRAMUSCULAR | Status: AC
  Administered 2019-12-21: 0.3 mg via INTRAMUSCULAR
  Filled 2019-12-21: qty 0.3

## 2019-12-21 MED ORDER — SODIUM CHLORIDE 0.9% FLUSH: 3.0000 mL | INTRAVENOUS | Status: DC | PRN

## 2019-12-21 MED ORDER — FAMOTIDINE IN NACL 20-0.9 MG/50ML-% IV SOLN: 20.0000 mg | INTRAVENOUS | Status: DC

## 2019-12-21 MED ORDER — FAMOTIDINE IN NACL 20-0.9 MG/50ML-% IV SOLN
20.0000 mg | Freq: Once | INTRAVENOUS | Status: AC
  Administered 2019-12-21: 20 mg via INTRAVENOUS
  Filled 2019-12-21: qty 50

## 2019-12-21 MED ORDER — ENOXAPARIN SODIUM 40 MG/0.4ML ~~LOC~~ SOLN: 40.0000 mg | SUBCUTANEOUS | Status: DC

## 2019-12-21 NOTE — ED Triage Notes (Signed)
C/o tongue swelling this morning.  Used Epi pen at 0745, no improvement.

## 2019-12-21 NOTE — ED Provider Notes (Signed)
[facility] Emergency Department Provider Note   ____________________________________________   First MD Initiated Contact with Patient 12/21/19 [phone]     (approximate)  I have reviewed the triage vital signs and the nursing notes.   HISTORY  Chief Complaint Allergic Reaction  EM caveat: Some limitation due to acuity of condition and concern for acute airway angioedema or anaphylaxis  HPI Aaron Woodard is a 49 y.o. male   previous history of swelling of his tongue due to possible allergic reaction  Patient reports same thing happened 3 years ago.  He woke up this morning he noticed that the right side of his tongue was very swollen.  He has an epinephrine pen at home at about 730 this morning which did not help at all prompting him to come to the ER.  Use naproxen last night which he uses on fairly regular almost daily basis, takes no other medications.  Ate pork ribs which is not unusual for him.  He is no rash.  No itching.  Denies that this is happened other people that he knows of in his family  Previously had this happen to him once stayed in the hospital for medications and it went away  No recent illness.  Denies Covid exposure.  rePorts he has been vaccinated  Past Medical History:  Diagnosis Date   Acute appendicitis with localized peritonitis    Appendicitis, acute 10/04/2016    Patient Active Problem List   Diagnosis Date Noted   Nicotine dependence 12/21/2019   Angioedema 11/10/2016   Appendicitis, acute 10/04/2016   Acute appendicitis with localized peritonitis     Past Surgical History:  Procedure Laterality Date   LAPAROSCOPIC APPENDECTOMY N/A 10/04/2016   Procedure: APPENDECTOMY LAPAROSCOPIC;  Surgeon: Florene Glen, MD;  Location: [facility] ORS;  Service: General;  Laterality: N/A;    Prior to Admission medications   Medication Sig Start Date End Date Taking? Authorizing Provider  EPINEPHrine 0.15 MG/0.15ML IJ injection  Inject 0.15 mLs (0.15 mg total) into the muscle as needed for anaphylaxis. 11/02/16  Yes Merlyn Lot, MD    Allergies Elmer Bales (diagnostic)  Family History  Problem Relation Age of Onset   Diabetes Mother    Hypertension Mother    Heart disease Mother    Healthy Father     Social History Social History   Tobacco Use   Smoking status: Current Every Day Smoker    Packs/day: 0.50    Types: Cigarettes   Smokeless tobacco: Never Used  Vaping Use   Vaping Use: Never used  Substance Use Topics   Alcohol use: Yes    Alcohol/week: 10.0 - 14.0 standard drinks    Types: 10 - 14 Standard drinks or equivalent per week    Comment: 2-3 Beers Daily   Drug use: No    Review of Systems Constitutional: No fever/chills or recent illness Eyes: No visual changes. ENT: No sore throat.  Not having difficulty breathing. Cardiovascular: Denies chest pain. Respiratory: Denies shortness of breath. Gastrointestinal: No abdominal pain.   Skin: Negative for rash. Neurological: Negative for headaches.     ____________________________________________   PHYSICAL EXAM:  VITAL SIGNS: ED Triage Vitals  Enc Vitals Group     BP 12/21/19 0841 (!) 173/110     Pulse Rate 12/21/19 0841 73     Resp 12/21/19 0841 20     Temp 12/21/19 0841 98.9 F (37.2 C)     Temp Source 12/21/19 0841 Oral     SpO2  12/21/19 0841 98 %     Weight 12/21/19 0836 220 lb 0.3 oz (99.8 kg)     Height 12/21/19 0836 5\' 10"  (1.778 m)     Head Circumference --      Peak Flow --      Pain Score 12/21/19 0836 0     Pain Loc --      Pain Edu? --      Excl. in [place]? --     Constitutional: Alert and oriented. Well appearing and in no acute distress. Eyes: Conjunctivae are normal. Head: Atraumatic. Nose: No congestion/rhinnorhea. Mouth/Throat: Mucous membranes are moist.  Right side of the lingula is moderately edematous with slight swelling over the left side as well.  Overall the tongue appears to have  angioedema, it appears to be limited to the tongue.  His voice is clear no hoarseness.  Swallowing secretions without difficulty fully alert and oriented Neck: No stridor.  Cardiovascular: Normal rate, regular rhythm. Grossly normal heart sounds.  Good peripheral circulation. Respiratory: Normal respiratory effort.  No retractions. Lungs CTAB. Gastrointestinal: Soft and nontender. No distention. Musculoskeletal: No lower extremity tenderness nor edema. Neurologic:  Normal speech and language. No gross focal neurologic deficits are appreciated.  Skin:  Skin is warm, dry and intact. No rash noted. Psychiatric: Mood and affect are normal. Speech and behavior are normal.  ____________________________________________   LABS (all labs ordered are listed, but only abnormal results are displayed)  Labs Reviewed  CBC - Abnormal; Notable for the following components:      Result Value   WBC 11.5 (*)    HCT 38.9 (*)    MCV 76.4 (*)    All other components within normal limits  BASIC METABOLIC PANEL - Abnormal; Notable for the following components:   Glucose, Bld 101 (*)    Calcium 8.4 (*)    All other components within normal limits  SARS CORONAVIRUS 2 BY RT PCR (HOSPITAL ORDER, [facility])  HIV ANTIBODY (ROUTINE TESTING W REFLEX)   ____________________________________________  EKG   ____________________________________________  RADIOLOGY   ____________________________________________   PROCEDURES  Procedure(s) performed: None  Procedures  Critical Care performed: Yes, see critical care note(s)  CRITICAL CARE Performed by: Delman Kitten   Total critical care time: 25 minutes  Critical care time was exclusive of separately billable procedures and treating other patients.  Critical care was necessary to treat or prevent imminent or life-threatening deterioration.  Critical care was time spent personally by me on the following activities:  development of treatment plan with patient and/or surrogate as well as nursing, discussions with consultants, evaluation of patient's response to treatment, examination of patient, obtaining history from patient or surrogate, ordering and performing treatments and interventions, ordering and review of laboratory studies, ordering and review of radiographic studies, pulse oximetry and re-evaluation of patient's condition.  ____________________________________________   INITIAL IMPRESSION / ASSESSMENT AND PLAN / ED COURSE  Pertinent labs & imaging results that were available during my care of the patient were reviewed by me and considered in my medical decision making (see chart for details).   Patient presents with angioedema.  He does not seem to have any other accompanying symptoms to suggest allergic reaction or anaphylaxis.  Denies itching no rash.  Is hypertensive.  Denies any recent illness.  No known exposure bites or stings.  No known precipitating factors and has had this happen to him seemingly randomly once before.  He is respirating comfortably, able to handle his  secretions.  Clinical Course as of Dec 20 1433  Wed Dec 21, 2019  0921 Paged S. Richardson Landry (ENT) to discuss angioedema and place consult.    [MQ]  [id number] Patient reevaluated, after receiving epinephrine, his angioedema appears unchanged.  He reports feels the same.  Right side of the tongue and a slight amount of the left side tongue appear to have angioedema.  He has no difficulty breathing.  He has good clearance between the tongue and the roof of the mouth.  Continuing to monitor very closely.   [MQ]  [phone] Patient reports that he feels slight improvement in his tongue, to my exam appears unchanged.  Does not appear to be responding to traditional anaphylaxis like treatment, but also does not have a clear precipitating factor for angioedema.  Continue to monitor closely as patient does report slight improvement, and discussed  hospitalization but at this point patient would prefer to be observed feeling that his symptoms are slowly improving which may well be true, on exam though I do not see appreciable change   [MQ]    Clinical Course User Index [MQ] Delman Kitten, MD   ----------------------------------------- 11:33 AM on 12/21/2019 -----------------------------------------  Patient reassessed, reports he feels like he is feeling slightly improved, but still has appreciable angioedema involving the right side of the tongue.  Does not appear to be worsening.  He has not responded to traditional therapies, discussed with the patient and will admit for careful observation airway observation to the hospital, hospitalist service  Admission discussed with Dr. Shelly Coss was evaluated in Emergency Department on 12/21/2019 for the symptoms described in the history of present illness. He was evaluated in the context of the global COVID-19 pandemic, which necessitated consideration that the patient might be at risk for infection with the SARS-CoV-2 virus that causes COVID-19. Institutional protocols and algorithms that pertain to the evaluation of patients at risk for COVID-19 are in a state of rapid change based on information released by regulatory bodies including the CDC and federal and state organizations. These policies and algorithms were followed during the patient's care in the ED.  ____________________________________________   FINAL CLINICAL IMPRESSION(S) / ED DIAGNOSES  Final diagnoses:  Angioedema, initial encounter        Note:  This document was prepared using Dragon voice recognition software and may include unintentional dictation errors       Delman Kitten, MD 12/21/19 1435

## 2019-12-21 NOTE — ED Notes (Addendum)
Pt A/Ox4, st "I want to go now".This RN educated pt regarding admission and pending ENT consult.  MD tochukwu made aware.

## 2019-12-21 NOTE — H&P (Signed)
History and Physical    Aaron Woodard AQT:[id number] DOB: Oct 10, 1970 DOA: 12/21/2019  PCP: Patient, No Pcp Per   Patient coming from: Home  I have personally briefly reviewed patient's old medical records in [place]  Chief Complaint: Tongue swelling  HPI: [name] is a 49 y.o. male with medical history significant for nicotine dependence and prior angioedema who presents to the emergency room for evaluation of swelling involving the right side of his tongue.  Patient had a similar episode about 3 years ago for which he was prescribed an EpiPen.  He used his EpiPen at about 730 this morning without any improvement in the swelling prompting him to come to the emergency room.  Patient had pork ribs which she has eaten in the past without any issues and also takes naproxen occasionally for low back pain.  He denies having any seafood allergy and has not had any seafood as well.  He also received another dose of EpiPen in the ER and during my evaluation the right side of his tongue is still swollen and his speech slightly muffled but per patient it is improved from presentation.  He denies having any difficulty swallowing.  And does not have any shortness of breath. Labs are within normal limits and vital signs are stable He will be referred to observation status for further evaluation  ED Course: Patient is a 49 year old African-American male who presents to the emergency room for evaluation of swelling involving the right side of his tongue.  Patient has had a prior episode of angioedema for which he was prescribed EpiPen and had used his EpiPen at home today without any improvement in the swelling.  He will be referred to observation status for further evaluation.  Review of Systems: As per HPI otherwise 10 point review of systems negative.    Past Medical History:  Diagnosis Date  . Acute appendicitis with localized peritonitis   . Appendicitis, acute 10/04/2016    Past  Surgical History:  Procedure Laterality Date  . LAPAROSCOPIC APPENDECTOMY N/A 10/04/2016   Procedure: APPENDECTOMY LAPAROSCOPIC;  Surgeon: Florene Glen, MD;  Location: [facility] ORS;  Service: General;  Laterality: N/A;     reports that he has been smoking cigarettes. He has been smoking about 0.50 packs per day. He has never used smokeless tobacco. He reports current alcohol use of about 10.0 - 14.0 standard drinks of alcohol per week. He reports that he does not use drugs.  Allergies  Allergen Reactions  . Almond (Diagnostic) Anaphylaxis    Family History  Problem Relation Age of Onset  . Diabetes Mother   . Hypertension Mother   . Heart disease Mother   . Healthy Father      Prior to Admission medications   Medication Sig Start Date End Date Taking? Authorizing Provider  EPINEPHrine 0.15 MG/0.15ML IJ injection Inject 0.15 mLs (0.15 mg total) into the muscle as needed for anaphylaxis. 11/02/16  Yes Merlyn Lot, MD    Physical Exam: Vitals:   12/21/19 1100 12/21/19 1110 12/21/19 1112 12/21/19 1203  BP: (!) 146/91     Pulse: (!) 58 60 64 62  Resp: (!) 21 (!) 23 (!) 22 19  Temp:      TempSrc:      SpO2: 95% 96% 95% 96%  Weight:      Height:         Vitals:   12/21/19 1100 12/21/19 1110 12/21/19 1112 12/21/19 1203  BP: (!) 146/91  Pulse: (!) 58 60 64 62  Resp: (!) 21 (!) 23 (!) 22 19  Temp:      TempSrc:      SpO2: 95% 96% 95% 96%  Weight:      Height:        Constitutional: NAD, alert and oriented x 3 Eyes: PERRL, lids and conjunctivae normal ENMT: Mucous membranes are moist.  Swelling involving the right side of the tongue Neck: normal, supple, no masses, no thyromegaly Respiratory: clear to auscultation bilaterally, no wheezing, no crackles. Normal respiratory effort. No accessory muscle use.  Cardiovascular: Regular rate and rhythm, no murmurs / rubs / gallops. No extremity edema. 2+ pedal pulses. No carotid bruits.  Abdomen: no tenderness, no  masses palpated. No hepatosplenomegaly. Bowel sounds positive.  Musculoskeletal: no clubbing / cyanosis. No joint deformity upper and lower extremities.  Skin: no rashes, lesions, ulcers.  Neurologic: No gross focal neurologic deficit. Psychiatric: Normal mood and affect.   Labs on Admission: I have personally reviewed following labs and imaging studies  CBC: Recent Labs  Lab 12/21/19 0912  WBC 11.5*  HGB 14.0  HCT 38.9*  MCV 76.4*  PLT 938   Basic Metabolic Panel: Recent Labs  Lab 12/21/19 0912  NA 138  K 3.5  CL 102  CO2 25  GLUCOSE 101*  BUN 12  CREATININE 0.90  CALCIUM 8.4*   GFR: Estimated Creatinine Clearance: 120.1 mL/min (by C-G formula based on SCr of 0.9 mg/dL). Liver Function Tests: No results for input(s): AST, ALT, ALKPHOS, BILITOT, PROT, ALBUMIN in the last 168 hours. No results for input(s): LIPASE, AMYLASE in the last 168 hours. No results for input(s): AMMONIA in the last 168 hours. Coagulation Profile: No results for input(s): INR, PROTIME in the last 168 hours. Cardiac Enzymes: No results for input(s): CKTOTAL, CKMB, CKMBINDEX, TROPONINI in the last 168 hours. BNP (last 3 results) No results for input(s): PROBNP in the last 8760 hours. HbA1C: No results for input(s): HGBA1C in the last 72 hours. CBG: No results for input(s): GLUCAP in the last 168 hours. Lipid Profile: No results for input(s): CHOL, HDL, LDLCALC, TRIG, CHOLHDL, LDLDIRECT in the last 72 hours. Thyroid Function Tests: No results for input(s): TSH, T4TOTAL, FREET4, T3FREE, THYROIDAB in the last 72 hours. Anemia Panel: No results for input(s): VITAMINB12, FOLATE, FERRITIN, TIBC, IRON, RETICCTPCT in the last 72 hours. Urine analysis:    Component Value Date/Time   COLORURINE YELLOW (A) 10/04/2016 0057   APPEARANCEUR CLEAR (A) 10/04/2016 0057   LABSPEC 1.006 10/04/2016 0057   PHURINE 6.0 10/04/2016 0057   GLUCOSEU NEGATIVE 10/04/2016 0057   HGBUR NEGATIVE 10/04/2016 0057    BILIRUBINUR NEGATIVE 10/04/2016 0057   [place] 10/04/2016 0057   PROTEINUR NEGATIVE 10/04/2016 0057   NITRITE NEGATIVE 10/04/2016 0057   LEUKOCYTESUR NEGATIVE 10/04/2016 0057    Radiological Exams on Admission: No results found.  EKG: Independently reviewed.    Assessment/Plan Active Problems:   Angioedema   Nicotine dependence    Angioedema Etiology unclear and may be related to NSAID use Patient is not on an ACE inhibitor or ARB Symptom control with antihistamines and steroids  Nicotine dependence Patient smokes about 1/2 pack of cigarettes daily Smoking cessation has been discussed with patient in detail We will place patient on nicotine transdermal patch 14 mg daily  DVT prophylaxis: Lovenox Code Status: Full Family Communication: Greater than 50% of time was spent discussing plan of care with patient at the bedside.  He verbalizes understanding and agrees  with the plan. Disposition Plan: Back to previous home environment Consults called: ENT    Nabiha Planck MD [facility]     12/21/2019, 1:26 PM

## 2019-12-21 NOTE — ED Notes (Addendum)
Pt made aware he is leaving status AMA. Pt ambulatory with a steady gait. NAD noted. A/Ox4. Speaking in clear sentences.

## 2019-12-21 NOTE — ED Notes (Signed)
Pt requesting this RN to messaged admitting MD. Pt st "I feel better. "I want to leave". This RN secured messaged MD [place] at this time.

## 2019-12-21 NOTE — ED Notes (Signed)
Report to [place], [place]

## 2019-12-21 NOTE — ED Notes (Addendum)
ED Provider Quale at bedside providing status update to pt regarding admission. Pt st "I can swallow better" Pt denies Cp/SHOB at this time. NAD noted at this time.  Right side of tongue noted to be swollen but no changes from when pt arrived per MD Quale and morning RN.

## 2019-12-21 NOTE — ED Notes (Addendum)
This RN called dietary requesting pt's meal at this time.

## 2019-12-21 NOTE — ED Notes (Signed)
Pt provided with lunch a this time.

## 2019-12-22 NOTE — Discharge Summary (Signed)
Physician Discharge Summary  Aaron Woodard MHD:[id number] DOB: 1970-09-28 DOA: 12/21/2019  PCP: Patient, No Pcp Per  Admit date: 12/21/2019 Discharge date: 12/21/2019  Admitted From: Home  Disposition:  Home  Recommendations for Outpatient Follow-up:  Follow up with PCP in 1-2 weeks Please obtain BMP/CBC in one week Please follow up on the following pending results:  Home Health: NO Equipment/Devices: None   Discharge Condition: AMA CODE STATUS: FULL code Diet recommendation: Heart Healthy  Brief/Interim Summary: Aaron Woodard is a 49 y.o. male with medical history significant for nicotine dependence and prior angioedema who presents to the emergency room for evaluation of swelling involving the right side of his tongue.  Patient had a similar episode about 3 years ago for which he was prescribed an EpiPen.  He used his EpiPen at about 730 am on the  morning of his admission  without any improvement in the swelling prompting him to come to the emergency room.  Patient had pork ribs which he has eaten in the past without any issues and also takes naproxen occasionally for low back pain.  He denied having any seafood allergy and has not had any seafood as well. He also received another dose of EpiPen in the ER and during my evaluation the right side of his tongue was still swollen and his speech slightly muffled but per patient he had improved from presentation.  He denied having any difficulty swallowing.  And does not have any shortness of breath. Labs were within normal limits and vital signs are stable He was referred to observation status for further evaluation. ENT consult was placed as well Patient signed out AMA before he was seen by ENT   Discharge Diagnoses:  Active Problems:   Angioedema   Nicotine dependence    Discharge Instructions    Allergies as of 12/21/2019       Reactions   Almond (diagnostic) Anaphylaxis        Medication List     ASK your doctor about these  medications    EPINEPHrine 0.15 MG/0.15ML injection Commonly known as: ADRENACLICK Inject 2.11 mLs (0.15 mg total) into the muscle as needed for anaphylaxis.         Allergies  Allergen Reactions   Almond (Diagnostic) Anaphylaxis    Consultations: ENT. Dr Richardson Landry   Procedures/Studies: No results found. (Echo, Carotid, EGD, Colonoscopy, ERCP)    Subjective:   Discharge Exam: Vitals:   12/21/19 1347 12/21/19 1721  BP:  139/90  Pulse: 84 65  Resp: 19 16  Temp:  97.9 F (36.6 C)  SpO2: 96% 97%   Vitals:   12/21/19 1112 12/21/19 1203 12/21/19 1347 12/21/19 1721  BP:    139/90  Pulse: 64 62 84 65  Resp: (!) 22 19 19 16   Temp:    97.9 F (36.6 C)  TempSrc:    Oral  SpO2: 95% 96% 96% 97%  Weight:      Height:        General: Pt is alert, awake, not in acute distress Cardiovascular: RRR, S1/S2 +, no rubs, no gallops Respiratory: CTA bilaterally, no wheezing, no rhonchi Abdominal: Soft, NT, ND, bowel sounds + Extremities: no edema, no cyanosis    The results of significant diagnostics from this hospitalization (including imaging, microbiology, ancillary and laboratory) are listed below for reference.     Microbiology: Recent Results (from the past 240 hour(s))  SARS Coronavirus 2 by RT PCR (hospital order, performed in [facility] hospital lab) Nasopharyngeal Nasopharyngeal Swab  Status: None   Collection Time: 12/21/19  9:12 AM   Specimen: Nasopharyngeal Swab  Result Value Ref Range Status   SARS Coronavirus 2 NEGATIVE NEGATIVE Final    Comment: (NOTE) SARS-CoV-2 target nucleic acids are NOT DETECTED.  The SARS-CoV-2 RNA is generally detectable in upper and lower respiratory specimens during the acute phase of infection. The lowest concentration of SARS-CoV-2 viral copies this assay can detect is 250 copies / mL. A negative result does not preclude SARS-CoV-2 infection and should not be used as the sole basis for treatment or other patient  management decisions.  A negative result may occur with improper specimen collection / handling, submission of specimen other than nasopharyngeal swab, presence of viral mutation(s) within the areas targeted by this assay, and inadequate number of viral copies (<250 copies / mL). A negative result must be combined with clinical observations, patient history, and epidemiological information.  Fact Sheet for Patients:   StrictlyIdeas.no  Fact Sheet for Healthcare Providers: BankingDealers.co.za  This test is not yet approved or  cleared by the [place] FDA and has been authorized for detection and/or diagnosis of SARS-CoV-2 by FDA under an Emergency Use Authorization (EUA).  This EUA will remain in effect (meaning this test can be used) for the duration of the COVID-19 declaration under Section 564(b)(1) of the Act, 21 U.S.C. section 360bbb-3(b)(1), unless the authorization is terminated or revoked sooner.  Performed at [facility], [address] [place], [place] [address]      Labs: BNP (last 3 results) No results for input(s): BNP in the last 8760 hours. Basic Metabolic Panel: Recent Labs  Lab 12/21/19 0912  NA 138  K 3.5  CL 102  CO2 25  GLUCOSE 101*  BUN 12  CREATININE 0.90  CALCIUM 8.4*   Liver Function Tests: No results for input(s): AST, ALT, ALKPHOS, BILITOT, PROT, ALBUMIN in the last 168 hours. No results for input(s): LIPASE, AMYLASE in the last 168 hours. No results for input(s): AMMONIA in the last 168 hours. CBC: Recent Labs  Lab 12/21/19 0912  WBC 11.5*  HGB 14.0  HCT 38.9*  MCV 76.4*  PLT 243   Cardiac Enzymes: No results for input(s): CKTOTAL, CKMB, CKMBINDEX, TROPONINI in the last 168 hours. BNP: Invalid input(s): POCBNP CBG: No results for input(s): GLUCAP in the last 168 hours. D-Dimer No results for input(s): DDIMER in the last 72 hours. Hgb A1c No results for input(s):  HGBA1C in the last 72 hours. Lipid Profile No results for input(s): CHOL, HDL, LDLCALC, TRIG, CHOLHDL, LDLDIRECT in the last 72 hours. Thyroid function studies No results for input(s): TSH, T4TOTAL, T3FREE, THYROIDAB in the last 72 hours.  Invalid input(s): FREET3 Anemia work up No results for input(s): VITAMINB12, FOLATE, FERRITIN, TIBC, IRON, RETICCTPCT in the last 72 hours. Urinalysis    Component Value Date/Time   COLORURINE YELLOW (A) 10/04/2016 0057   APPEARANCEUR CLEAR (A) 10/04/2016 0057   LABSPEC 1.006 10/04/2016 0057   PHURINE 6.0 10/04/2016 0057   GLUCOSEU NEGATIVE 10/04/2016 0057   HGBUR NEGATIVE 10/04/2016 0057   BILIRUBINUR NEGATIVE 10/04/2016 0057   [place] 10/04/2016 0057   PROTEINUR NEGATIVE 10/04/2016 0057   NITRITE NEGATIVE 10/04/2016 0057   LEUKOCYTESUR NEGATIVE 10/04/2016 0057   Sepsis Labs Invalid input(s): PROCALCITONIN,  WBC,  LACTICIDVEN Microbiology Recent Results (from the past 240 hour(s))  SARS Coronavirus 2 by RT PCR (hospital order, performed in [facility] hospital lab) Nasopharyngeal Nasopharyngeal Swab     Status: None  Collection Time: 12/21/19  9:12 AM   Specimen: Nasopharyngeal Swab  Result Value Ref Range Status   SARS Coronavirus 2 NEGATIVE NEGATIVE Final    Comment: (NOTE) SARS-CoV-2 target nucleic acids are NOT DETECTED.  The SARS-CoV-2 RNA is generally detectable in upper and lower respiratory specimens during the acute phase of infection. The lowest concentration of SARS-CoV-2 viral copies this assay can detect is 250 copies / mL. A negative result does not preclude SARS-CoV-2 infection and should not be used as the sole basis for treatment or other patient management decisions.  A negative result may occur with improper specimen collection / handling, submission of specimen other than nasopharyngeal swab, presence of viral mutation(s) within the areas targeted by this assay, and inadequate number of viral  copies (<250 copies / mL). A negative result must be combined with clinical observations, patient history, and epidemiological information.  Fact Sheet for Patients:   StrictlyIdeas.no  Fact Sheet for Healthcare Providers: BankingDealers.co.za  This test is not yet approved or  cleared by the [place] FDA and has been authorized for detection and/or diagnosis of SARS-CoV-2 by FDA under an Emergency Use Authorization (EUA).  This EUA will remain in effect (meaning this test can be used) for the duration of the COVID-19 declaration under Section 564(b)(1) of the Act, 21 U.S.C. section 360bbb-3(b)(1), unless the authorization is terminated or revoked sooner.  Performed at [facility], [address] [place], [place] [address]      Time coordinating discharge: Over 30 minutes  SIGNED:   Collier Bullock, MD  [facility] 12/22/2019, 7:27 AM Pager   If 7PM-7AM, please contact night-coverage [email or web address] Password [email or web address]

## 2020-08-17 ENCOUNTER — Emergency Department: Payer: PRIVATE HEALTH INSURANCE

## 2020-08-17 ENCOUNTER — Emergency Department
Admission: EM | Admit: 2020-08-17 | Discharge: 2020-08-17 | Disposition: A | Discharge status: Home / Self Care | Payer: PRIVATE HEALTH INSURANCE | Attending: Emergency Medicine | Admitting: Emergency Medicine

## 2020-08-17 ENCOUNTER — Other Ambulatory Visit: Payer: Self-pay

## 2020-08-17 DIAGNOSIS — J988 Other specified respiratory disorders: Secondary | ICD-10-CM | POA: Insufficient documentation

## 2020-08-17 DIAGNOSIS — I10 Essential (primary) hypertension: Secondary | ICD-10-CM

## 2020-08-17 DIAGNOSIS — F17210 Nicotine dependence, cigarettes, uncomplicated: Secondary | ICD-10-CM | POA: Diagnosis not present

## 2020-08-17 DIAGNOSIS — T783XXA Angioneurotic edema, initial encounter: Secondary | ICD-10-CM

## 2020-08-17 DIAGNOSIS — R1310 Dysphagia, unspecified: Secondary | ICD-10-CM | POA: Diagnosis present

## 2020-08-17 LAB — CBC WITH DIFFERENTIAL/PLATELET
Abs Immature Granulocytes: 0.04 10*3/uL (ref 0.00–0.07)
Basophils Absolute: 0 10*3/uL (ref 0.0–0.1)
Basophils Relative: 0 %
Eosinophils Absolute: 0.3 10*3/uL (ref 0.0–0.5)
Eosinophils Relative: 2 %
HCT: 40.2 % (ref 39.0–52.0)
Hemoglobin: 14.4 g/dL (ref 13.0–17.0)
Immature Granulocytes: 0 %
Lymphocytes Relative: 33 %
Lymphs Abs: 4.1 10*3/uL — ABNORMAL HIGH (ref 0.7–4.0)
MCH: 27.3 pg (ref 26.0–34.0)
MCHC: 35.8 g/dL (ref 30.0–36.0)
MCV: 76.3 fL — ABNORMAL LOW (ref 80.0–100.0)
Monocytes Absolute: 1.3 10*3/uL — ABNORMAL HIGH (ref 0.1–1.0)
Monocytes Relative: 11 %
Neutro Abs: 6.6 10*3/uL (ref 1.7–7.7)
Neutrophils Relative %: 54 %
Platelets: 191 10*3/uL (ref 150–400)
RBC: 5.27 MIL/uL (ref 4.22–5.81)
RDW: 14.8 % (ref 11.5–15.5)
WBC: 12.4 10*3/uL — ABNORMAL HIGH (ref 4.0–10.5)
nRBC: 0 % (ref 0.0–0.2)

## 2020-08-17 LAB — BASIC METABOLIC PANEL
Anion gap: 10 (ref 5–15)
BUN: 13 mg/dL (ref 6–20)
CO2: 24 mmol/L (ref 22–32)
Calcium: 8.9 mg/dL (ref 8.9–10.3)
Chloride: 104 mmol/L (ref 98–111)
Creatinine, Ser: 0.93 mg/dL (ref 0.61–1.24)
GFR, Estimated: >60 mL/min (ref >60)
Glucose, Bld: 101 mg/dL — ABNORMAL HIGH (ref 70–99)
Potassium: 3.8 mmol/L (ref 3.5–5.1)
Sodium: 138 mmol/L (ref 135–145)

## 2020-08-17 MED ORDER — EPINEPHRINE 0.3 MG/0.3ML IJ SOAJ: 0.3000 mg | Freq: Once | INTRAMUSCULAR | 0 refills | Status: AC

## 2020-08-17 MED ORDER — METHYLPREDNISOLONE SODIUM SUCC 125 MG IJ SOLR
125.0000 mg | INTRAMUSCULAR | Status: AC
  Administered 2020-08-17: 125 mg via INTRAVENOUS
  Filled 2020-08-17: qty 2

## 2020-08-17 MED ORDER — EPINEPHRINE 0.15 MG/0.3ML IJ SOAJ
0.1500 mg | Freq: Once | INTRAMUSCULAR | Status: AC
  Administered 2020-08-17: 0.15 mg via INTRAMUSCULAR
  Filled 2020-08-17: qty 0.3

## 2020-08-17 MED ORDER — DIPHENHYDRAMINE HCL 50 MG/ML IJ SOLN
50.0000 mg | Freq: Once | INTRAMUSCULAR | Status: AC
  Administered 2020-08-17: 50 mg via INTRAVENOUS
  Filled 2020-08-17: qty 1

## 2020-08-17 MED ORDER — PREDNISONE 50 MG PO TABS
ORAL_TABLET | ORAL | 0 refills | Status: DC
Start: 2020-08-17 — End: 2021-08-05

## 2020-08-17 MED ORDER — PREDNISONE 20 MG PO TABS
40.0000 mg | ORAL_TABLET | Freq: Once | ORAL | Status: AC
  Administered 2020-08-17: 40 mg via ORAL
  Filled 2020-08-17: qty 2

## 2020-08-17 NOTE — ED Notes (Signed)
Pt speaking in full sentences, breathing normally. Pt denies any pain at this time.

## 2020-08-17 NOTE — Consult Note (Signed)
[place], [place] [id number] 1971-05-03 Delman Kitten, MD  Reason for Consult: Evaluate because of angioedema  HPI: Patient is a 50 year old African-American male who presented emergency department for evaluation of feeling like there was something stuck in his throat and having some difficulty with swallowing.  He states that he has no difficulty breathing but that is uncomfortable to swallow or talk.  He was able to manage his secretions without spitting.  The patient states that he has had several instances of angioedema previously.  These did require several hospitalizations at this facility in the past.  He states that he does feel different than his previous angioedema, as previously it was the tongue and lips and not in the throat.  He does report noticeable voice change.  Patient states that was between 2 and 3 PM when the symptoms started today.  He states that after the last episode of angioedema he found out that he was allergic to pork and almonds and also knows that he is allergic to pollen.  He states he was outside a lot today and the pollen and wondered if this may have triggered it.  He has not been on any antihistamines recently although he has used them previously.  He does have an EpiPen that he carries but he has not used it today.  He went to the emergency room in [place] who did a soft tissue of his neck that showed some soft tissue thickening and sent him to the emergency room.  He is been seen here and evaluated and treated with 50 mg of Benadryl along with Solu-Medrol 125 mg.  He was given a small amount of epipen because of elevated blood pressure he did not get a full dose.  Its been about 45 minutes since treatment and he is feeling like his throat is better and he is not short of breath at all.  He still is a little bit hoarse when he talks and can feel like a lump in his throat when he swallows.  Allergies:  Allergies  Allergen Reactions  . Almond (Diagnostic) Anaphylaxis  .  Pork-Derived Products Swelling    ROS: Review of systems normal other than 12 systems except per HPI.  PMH:  Past Medical History:  Diagnosis Date  . Acute appendicitis with localized peritonitis   . Appendicitis, acute 10/04/2016    FH:  Family History  Problem Relation Age of Onset  . Diabetes Mother   . Hypertension Mother   . Heart disease Mother   . Healthy Father     SH:  Social History   Socioeconomic History  . Marital status: Married    Spouse name: Not on file  . Number of children: Not on file  . Years of education: Not on file  . Highest education level: Not on file  Occupational History  . Not on file  Tobacco Use  . Smoking status: Current Every Day Smoker    Packs/day: 0.50    Types: Cigarettes  . Smokeless tobacco: Never Used  Vaping Use  . Vaping Use: Never used  Substance and Sexual Activity  . Alcohol use: Yes    Alcohol/week: 10.0 - 14.0 standard drinks    Types: 10 - 14 Standard drinks or equivalent per week    Comment: 2-3 Beers Daily  . Drug use: No  . Sexual activity: Yes  Other Topics Concern  . Not on file  Social History Narrative  . Not on file   Social Determinants of Health  Financial Resource Strain: Not on file  Food Insecurity: Not on file  Transportation Needs: Not on file  Physical Activity: Not on file  Stress: Not on file  Social Connections: Not on file  Intimate Partner Violence: Not on file    PSH:  Past Surgical History:  Procedure Laterality Date  . LAPAROSCOPIC APPENDECTOMY N/A 10/04/2016   Procedure: APPENDECTOMY LAPAROSCOPIC;  Surgeon: Florene Glen, MD;  Location: [facility] ORS;  Service: General;  Laterality: N/A;    Physical  Exam: Patient is awake and alert very cooperative.  He is not in any respiratory distress at all.  He is breathing easily on room air with good oxygenation.  His voice is a little bit hoarse and he clears his throat at times.  His nose is open and clear and his oropharynx shows no  oral lesions at all.  His neck is negative for any nodes or masses and there is no swelling noted in his neck.  Flexible laryngoscopy is done through his right nostril and this is dictated in detail elsewhere.  This shows some watery edema of his right arytenoid and aryepiglottic fold.  He does not have any swelling in his laryngeal inlet or in his cords in does not shows significant obstruction at this time.  There is no sign of infection or swelling elsewhere in his hypopharynx or larynx.   A/P: He has a very early presentation of some angioedema that is involving his right arytenoid and aryepiglottic fold.  This seems to been caught early enough that it is turning around and he should not have any significant risk of laryngeal obstruction.  He will continue to be watched in the emergency room for another hour or 2 to make sure that his throat is getting even better.  He will continue to take Benadryl 50 mg every 4 hours until his symptoms are completely gone.  He knows he will need to remain on a 24-hour antihistamine daily at least through the spring season.  He can use Zyrtec, Allegra, or Xyzal.  He recently had angioedema and some allergy testing at the last couple of months.  I do not think he needs further testing at this time since it was done so recently.  He will use a daily antihistamine but then add Benadryl immediately if there is any signs of swelling anywhere that might suggest worsening allergy symptoms.   [name] 08/17/2020 8:37 PM

## 2020-08-17 NOTE — Discharge Instructions (Signed)
Please begin taking a daily antihistamine such as Zyrtec or Claritin over-the-counter as directed on packaging.  In addition for the next 2 days, recommend to utilize diphenhydramine 50 mg by mouth every 6 hours in addition to prescribed prednisone.  Diphenhydramine can be purchased over-the-counter.  Return to the emergency room right away should you experience any worsening of swelling difficulty breathing, worsening of voice or other concerns that may arise.  Additionally your blood pressure is noted to be elevated, and I do recommend you follow-up with primary care physician as well for recheck of this.

## 2020-08-17 NOTE — Op Note (Signed)
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  This does not include the epiglottis.  His laryngeal inlet is clear and the vocal cords are not swollen at all.  Good mobility of the cords there is no mucus collecting anywhere.  There is no signs of infection or inflammation.  He is handling his secretions well.  Procedure: Patient was seen at the bedside for evaluation of his larynx.  His nose is sprayed with 4% Xylocaine mixed with Afrin.  This was done on both sides.  This is allowed to sit for a minute while the scope was readied and lubricated.  The flexible scope was used to visualize the anterior nose and it appeared as though the right nostril was a little bit more open.  His septum appeared to be straight and I could pass the scope through the right nostril easily without causing any discomfort.  The nasopharynx was clear as well.  There is no swelling or inflammation here.  The hypopharynx showed a normal tongue base with no swelling of the vallecula.  Piriform sinuses were clear.  Epiglottis looked normal but there was some watery edema of his right aryepiglottic fold.  His left arytenoid had barely any swelling and the left aryepiglottic fold was normal.  His laryngeal inlet did not show any swelling.  His vocal cords were pearly white moved well the midline and opened widely.  There did not appear to be swelling in the larynx or in the subglottic area.  Did not have any mucus collecting anywhere.  There is no sign of infection or other lesions.  Dispo:   This was done at the bedside in the patient tolerated this  well  Plan: The patient will be observed in the emergency room longer for resolution of the swelling  [name]  08/17/2020 8:32 PM

## 2020-08-17 NOTE — ED Triage Notes (Addendum)
PT states he feels like he has something caught in his throat and it is hard to swallow. PT is able to breathe and talk but is very uncomfortable. Managing secretions. States he hasn't eaten in a while besides a bowl of cereal at an unknown time. 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 Sx since 2pm today.

## 2020-08-17 NOTE — ED Provider Notes (Signed)
Patient reassessed.  Feels significantly improved.  Tolerating p.o.  Normal phonation.  Does appear stable and appropriate for outpatient follow-up.   Merlyn Lot, MD 08/17/20 2255

## 2020-08-17 NOTE — ED Provider Notes (Signed)
Medical screening examination/treatment/procedure(s) were conducted as a shared visit with non-physician practitioner(s) and myself.  I personally evaluated the patient during the encounter.  Here for evaluation of feeling of swelling in his throat slight coughing.  Reports it was worse actually seems to be improving.  History of angioedema.  Patient unclear for trigger, thinks she does have a pork allergy potentially possibly almonds.  No known exposure today though.  Does not take any medications.  ----------------------------------------- 8:43 PM on 08/17/2020 -----------------------------------------  SBP 170/90  Laryngoscopy performed by Dr. Ladene Artist.  Recommends period of observation, if patient continue to improve over the next couple hours can be discharged home with return precautions.  I discussed this plan with the patient, he does report he is slowly improving.  Resting quite comfortably at this time.  Discussed treatment recommendations including use of Benadryl over-the-counter the next couple of days, use of a daily y antihistamine such as Zyrtec, and follow-up plan with ENT  Very careful return precautions discussed including prescription for prednisone epinephrine sent to patient's pharmacy of choice.  He is improving.  Is generally, would observe the patient a little bit longer but he reports his wife had a hysterectomy today and he strongly wishes to build to go home to continue care for her as his daughter who is caring for her now has to go to work.  I think this is somewhat reasonable we discussed and with shared medical decision making the patient should be able to go home if improving over the next hour, and very careful return precautions which she is agreeable with.  Ongoing care signed Dr. Quentin Cornwall including reassessment after period of observation in the ED for angioedema   Delman Kitten, MD 08/17/20 2102

## 2020-08-17 NOTE — ED Provider Notes (Signed)
[facility] Emergency Department Provider Note  ____________________________________________   Event Date/Time   First MD Initiated Contact with Patient 08/17/20 [phone]     (approximate)  I have reviewed the triage vital signs and the nursing notes.   HISTORY  Chief Complaint Airway Obstruction  HPI Aaron Woodard is a 50 y.o. male who presents to the emergency department for evaluation of feeling like something is caught in his throat and having difficulty swallowing.  Patient states that he has no difficulty breathing, but states that it is uncomfortable to swallow or talk.  He has been able to manage his secretions without spitting.  Patient states that he has had several instances of angioedema before.  These did require several hospitalizations at this facility in the past.  He states that this does feel different than his previous angioedema, as previously it was the tongue and lips and not in the throat.  He does report noticeable voice change.  Patient states that around 3 PM, is when the symptoms started today.  He states that after last episode of angioedema, he did find out that he is allergic to pork and almonds, also knew that he was allergic to pollen.  He states that he was outside a lot today in the pollen, is unsure if this could have triggered it.  He does have an EpiPen that he carries, however he has not used it today.  He presented to the urgent care in [place], who referred him to the emergency department.   Of note, the patient's blood pressure is quite elevated in triage, he states he has never been told he has a history of high blood pressure.  He denies any headache, dizziness, chest pain or shortness of breath.  He also denies any nausea, vomiting, diarrhea or abdominal pain.  He denies any known rashes or urticaria.        Past Medical History:  Diagnosis Date  . Acute appendicitis with localized peritonitis   . Appendicitis, acute  10/04/2016    Patient Active Problem List   Diagnosis Date Noted  . Nicotine dependence 12/21/2019  . Angioedema 11/10/2016  . Appendicitis, acute 10/04/2016  . Acute appendicitis with localized peritonitis     Past Surgical History:  Procedure Laterality Date  . LAPAROSCOPIC APPENDECTOMY N/A 10/04/2016   Procedure: APPENDECTOMY LAPAROSCOPIC;  Surgeon: Florene Glen, MD;  Location: [facility] ORS;  Service: General;  Laterality: N/A;    Prior to Admission medications   Medication Sig Start Date End Date Taking? Authorizing Provider  EPINEPHrine 0.15 MG/0.15ML IJ injection Inject 0.15 mLs (0.15 mg total) into the muscle as needed for anaphylaxis. 11/02/16   Merlyn Lot, MD    Allergies Almond (diagnostic) and Pork-derived products  Family History  Problem Relation Age of Onset  . Diabetes Mother   . Hypertension Mother   . Heart disease Mother   . Healthy Father     Social History Social History   Tobacco Use  . Smoking status: Current Every Day Smoker    Packs/day: 0.50    Types: Cigarettes  . Smokeless tobacco: Never Used  Vaping Use  . Vaping Use: Never used  Substance Use Topics  . Alcohol use: Yes    Alcohol/week: 10.0 - 14.0 standard drinks    Types: 10 - 14 Standard drinks or equivalent per week    Comment: 2-3 Beers Daily  . Drug use: No    Review of Systems Constitutional: No fever/chills Eyes: No visual  changes. ENT: + Globus sensation in throat, , + voice change Cardiovascular: Denies chest pain. Respiratory: Denies shortness of breath. Gastrointestinal: No abdominal pain.  No nausea, no vomiting.  No diarrhea.  No constipation. Genitourinary: Negative for dysuria. Musculoskeletal: Negative for back pain. Skin: Negative for rash. Neurological: Negative for headaches, focal weakness or numbness.  ____________________________________________   PHYSICAL EXAM:  VITAL SIGNS: ED Triage Vitals  Enc Vitals Group     BP 08/17/20 1752 (!)  212/118     Pulse Rate 08/17/20 1752 77     Resp 08/17/20 1752 20     Temp 08/17/20 1752 98 F (36.7 C)     Temp Source 08/17/20 1752 Oral     SpO2 08/17/20 1752 98 %     Weight 08/17/20 1756 205 lb (93 kg)     Height 08/17/20 1756 5\' 10"  (1.778 m)     Head Circumference --      Peak Flow --      Pain Score 08/17/20 1756 0     Pain Loc --      Pain Edu? --      Excl. in [place]? --    Constitutional: Alert and oriented. Well appearing and in no acute distress. Eyes: Conjunctivae are normal. PERRL. EOMI. Head: Atraumatic. Nose: No congestion/rhinnorhea. Mouth/Throat: Mucous membranes are moist.  Oropharynx non-erythematous.  There is no oral or tongue swelling noted. Neck: No stridor.  No palpable masses.  No carotid bruits.  There is noticeable raspy voice. Cardiovascular: Normal rate, regular rhythm. Grossly normal heart sounds.  Good peripheral circulation. Respiratory: Normal respiratory effort.  No retractions. Lungs CTAB. Gastrointestinal: Soft and nontender. No distention. No abdominal bruits. No CVA tenderness. Musculoskeletal: No lower extremity tenderness nor edema.  No joint effusions. Neurologic:  Normal speech and language. No gross focal neurologic deficits are appreciated. No gait instability. Skin:  Skin is warm, dry and intact. No rash noted. Psychiatric: Mood and affect are normal. Speech and behavior are normal.  ____________________________________________  RADIOLOGY I, Marlana Salvage, personally viewed and evaluated these images (plain radiographs) as part of my medical decision making, as well as reviewing the written report by the radiologist.  ED provider interpretation: Supraglottic soft tissue swelling with decreased tracheal space  Official radiology report(s): DG Neck Soft Tissue  Result Date: 08/17/2020 CLINICAL DATA:  Possible body.  Swallowing. EXAM: NECK SOFT TISSUES - 1+ VIEW COMPARISON:  None. FINDINGS: Focal soft tissue density is present in the  supraglottic airway. Epiglottis appears to be normal. Degenerative changes are noted in the cervical spine. Lung apices are clear. IMPRESSION: 1. Focal soft tissue density in the supraglottic airway. Question soft tissue mass or body. CT of the neck with contrast. Patient is unstable for CT, consider ENT consult. Critical Value/emergent results were called by telephone at the time of interpretation on 08/17/2020 at 7:10 pm to provider Merlyn Lot , who verbally acknowledged these results. Electronically Signed   By: San Morelle M.D.   On: 08/17/2020 19:12   ____________________________________________   INITIAL IMPRESSION / ASSESSMENT AND PLAN / ED COURSE  As part of my medical decision making, I reviewed the following data within the [place] notes reviewed and incorporated, Radiograph reviewed, Discussed with radiologist, Evaluated by EM attending Dr. Jacqualine Code and Notes from prior ED visits        Patient is a 50 year old male who presents to the emergency department for evaluation of globus sensation and difficulty swallowing since around 3 PM  today.  He reports continuing to manage that and saliva, however reports change in voice.  Denies chest pain, shortness of breath, dizziness, headache.  Does have significant history of angioedema.  In triage, patient has a markedly elevated blood pressure of 212/118, also reporting no history of hypertension.  Otherwise vitals are within normal limits.  On physical exam, patient does continue to manage secretions, does not appear in acute distress.  Despite this, he does have a noticeable voice change with past history for angioedema.  X-ray was obtained from triage, which is reviewed by myself.  I also received a call from radiology regarding the patient's supraglottic airway soft tissue density.  They recommended CT scan if the patient was stable, or ENT consult if unstable.  Given the complexity of this patient,  including his possibility of rapid decompensation, I immediately obtained the consult of attending physician Dr. Jacqualine Code who immediately came and personally evaluated the patient.  Given these findings, the patient will be transferred to the main side of the emergency department for further evaluation and care as well as consideration for ENT consult.  The patient is stable at this time during transfer to the main side of the ER.  Clinical Course as of 08/17/20 1941  Fri Aug 17, 2020  1910 Received call from radiology regarding patient's supraglottic soft tissue density, recommended neck CT if stable or ENT consult if unstable [CR]  1938 Discussed with Dr. Kathyrn Sheriff, will obtain ENT consult in ER tonight.  [MQ]    Clinical Course User Index [CR] Marlana Salvage, PA [MQ] Delman Kitten, MD     ____________________________________________   FINAL CLINICAL IMPRESSION(S) / ED DIAGNOSES  Final diagnoses:  None     ED Discharge Orders    None      *Please note:  Avary Pitsenbarger was evaluated in Emergency Department on 08/17/2020 for the symptoms described in the history of present illness. He was evaluated in the context of the global COVID-19 pandemic, which necessitated consideration that the patient might be at risk for infection with the SARS-CoV-2 virus that causes COVID-19. Institutional protocols and algorithms that pertain to the evaluation of patients at risk for COVID-19 are in a state of rapid change based on information released by regulatory bodies including the CDC and federal and state organizations. These policies and algorithms were followed during the patient's care in the ED.  Some ED evaluations and interventions may be delayed as a result of limited staffing during and the pandemic.*   Note:  This document was prepared using Dragon voice recognition software and may include unintentional dictation errors.   Marlana Salvage, PA 08/17/20 1941    Delman Kitten, MD 08/17/20  2130

## 2020-08-17 NOTE — ED Notes (Signed)
Pt able to talk normally in complete sentences. Denies difficulty breathing. o2 sats 98% RA

## 2021-04-17 DIAGNOSIS — T7840XA Allergy, unspecified, initial encounter: Secondary | ICD-10-CM | POA: Diagnosis not present

## 2021-05-22 DIAGNOSIS — U071 COVID-19: Secondary | ICD-10-CM | POA: Diagnosis not present

## 2021-07-03 DIAGNOSIS — T783XXA Angioneurotic edema, initial encounter: Secondary | ICD-10-CM | POA: Diagnosis not present

## 2021-07-03 DIAGNOSIS — X58XXXA Exposure to other specified factors, initial encounter: Secondary | ICD-10-CM | POA: Diagnosis not present

## 2021-07-03 DIAGNOSIS — T7840XA Allergy, unspecified, initial encounter: Secondary | ICD-10-CM | POA: Diagnosis not present

## 2021-08-05 ENCOUNTER — Other Ambulatory Visit: Payer: Self-pay

## 2021-08-05 ENCOUNTER — Encounter: Payer: Self-pay | Admitting: Family Medicine

## 2021-08-05 ENCOUNTER — Other Ambulatory Visit: Payer: Self-pay | Admitting: Family Medicine

## 2021-08-05 ENCOUNTER — Ambulatory Visit: Payer: BC Managed Care – PPO | Admitting: Family Medicine

## 2021-08-05 VITALS — BP 144/84 | HR 101 | Ht 70.0 in | Wt 216.8 lb

## 2021-08-05 DIAGNOSIS — N529 Male erectile dysfunction, unspecified: Secondary | ICD-10-CM

## 2021-08-05 DIAGNOSIS — H1033 Unspecified acute conjunctivitis, bilateral: Secondary | ICD-10-CM

## 2021-08-05 DIAGNOSIS — I10 Essential (primary) hypertension: Secondary | ICD-10-CM | POA: Diagnosis not present

## 2021-08-05 DIAGNOSIS — Z0000 Encounter for general adult medical examination without abnormal findings: Secondary | ICD-10-CM

## 2021-08-05 DIAGNOSIS — Z113 Encounter for screening for infections with a predominantly sexual mode of transmission: Secondary | ICD-10-CM | POA: Diagnosis not present

## 2021-08-05 DIAGNOSIS — Z7689 Persons encountering health services in other specified circumstances: Secondary | ICD-10-CM

## 2021-08-05 DIAGNOSIS — Z13220 Encounter for screening for lipoid disorders: Secondary | ICD-10-CM

## 2021-08-05 DIAGNOSIS — Z833 Family history of diabetes mellitus: Secondary | ICD-10-CM

## 2021-08-05 DIAGNOSIS — E669 Obesity, unspecified: Secondary | ICD-10-CM

## 2021-08-05 DIAGNOSIS — Z125 Encounter for screening for malignant neoplasm of prostate: Secondary | ICD-10-CM

## 2021-08-05 DIAGNOSIS — Z1159 Encounter for screening for other viral diseases: Secondary | ICD-10-CM

## 2021-08-05 MED ORDER — AMLODIPINE BESYLATE 10 MG PO TABS: 10.0000 mg | ORAL_TABLET | Freq: Every day | ORAL | 2 refills | Status: DC

## 2021-08-05 MED ORDER — SILDENAFIL CITRATE 20 MG PO TABS: ORAL_TABLET | ORAL | 3 refills | Status: DC

## 2021-08-05 MED ORDER — POLYMYXIN B-TRIMETHOPRIM 10000-0.1 UNIT/ML-% OP SOLN: 1.0000 [drp] | Freq: Four times a day (QID) | OPHTHALMIC | 0 refills | Status: DC

## 2021-08-05 NOTE — Patient Instructions (Addendum)
Thank you for coming to the office today. ? ?Start checking Blood Pressure readings at home, usually resting for 5-10 minutes prior to check, morning or evening, ? ?Goal reading < 135 / 85 (perfect) - definitely needs to be lower than 140 / 90 - either number or both. ? ?Start medication if you see consistent results 4-5 days or more out of a week with higher reading. ?- Amlodipine '10mg'$  tabs, if you can start with HALF tablet for '5mg'$  for first 1-2 weeks. Then go to full tablet. ? ?If readings are mostly in range, 1-2 readings max elevated, then the rest normal, you can hold off on the medicine ? ? ?Colon Cancer Screening: ?- For all adults age 68+ routine colon cancer screening is highly recommended. ?    - Recent guidelines from [place] recommend starting age of 50 ?- Early detection of colon cancer is important, because often there are no warning signs or symptoms, also if found early usually it can be cured. Late stage is hard to treat. ?- Special circumstances in patients with early family history of colon cancer, we recommend colonoscopy at a younger age (usually 64 years before the family member was diagnosed). ? ?- Colonoscopy is the best test for colon cancer because it involves direct visualization and immediate treatment (compared to other imaging studies or stool cards to test for blood, that will require you to eventually get a colonoscopy if they are abnormal). ?- Also to consider, Cologuard is an excellent alternative for screening test for Colon Cancer. It is highly sensitive for detecting DNA of colon cancer from even the earliest stages. Also, there is NO bowel prep required. ?------------------------- ? ? ?Please schedule a Follow-up Appointment to: Return in about 4 weeks (around 09/02/2021) for 4 weeks fasting lab only then 1 week later Annual Physical. ? ?If you have any other questions or concerns, please feel free to call the office or send a message through [place]. You may  also schedule an earlier appointment if necessary. ? ?Additionally, you may be receiving a survey about your experience at our office within a few days to 1 week by e-mail or mail. We value your feedback. ? ?Nobie Putnam, DO ?[place] ?

## 2021-08-05 NOTE — Addendum Note (Signed)
Addended by: Olin Hauser on: 08/05/2021 06:06 PM ? ? Modules accepted: Orders ? ?

## 2021-08-05 NOTE — Progress Notes (Signed)
? ?Subjective:  ? ? Patient ID: Aaron Woodard, male    DOB: 1971/04/10, 51 y.o.   MRN: [id number] ? ?Aaron Woodard is a 51 y.o. male presenting on 08/05/2021 for Establish Care ? ? ?HPI ? ?Establish care, no recent/prior PCP ? ?Family history of Diabetes, mother and maternal aunts and cousin. ?Family history of heart disease. ? ?Upcoming oral procedure/surgery ?He says it was delayed due to high blood pressure ? 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 ?On Cetirizine ?He had been doing well but recently had another mild flare angioedema. ?Avoid NSAIDs OTC ?Now takes Tylenol only PRN ?They are going to offer allergy shots in future ? ?CHRONIC HTN: ?Reports elevated BP no prior confirmed diagnosis. ?Current Meds - None   ?Denies CP, dyspnea, HA, edema, dizziness / lightheadedness ? ? ?Requesting STD Testing ? ?Conjunctivitis, bilateral eye redness irritation ? ? ?Depression screen [facility] 2/9 08/05/2021  ?Decreased Interest 0  ?Down, Depressed, Hopeless 0  ?PHQ - 2 Score 0  ?Altered sleeping 0  ?Tired, decreased energy 1  ?Change in appetite 0  ?Feeling bad or failure about yourself  0  ?Trouble concentrating 0  ?Moving slowly or fidgety/restless 0  ?Suicidal thoughts 0  ?PHQ-9 Score 1  ?Difficult doing work/chores Not difficult at all  ? ? ?Past Medical History:  ?Diagnosis Date  ? Acute appendicitis with localized peritonitis   ? Allergy   ? Appendicitis, acute 10/04/2016  ? ?Past Surgical History:  ?Procedure Laterality Date  ? LAPAROSCOPIC APPENDECTOMY N/A 10/04/2016  ? Procedure: APPENDECTOMY LAPAROSCOPIC;  Surgeon: Florene Glen, MD;  Location: [facility] ORS;  Service: General;  Laterality: N/A;  ? ?Social History  ? ?Socioeconomic History  ? Marital  status: Married  ?  Spouse name: Not on file  ? Number of children: Not on file  ? Years of education: Not on file  ? Highest education level: Not on file  ?Occupational History  ? Not on file  ?Tobacco Use  ? Smoking status: Every Day  ?  Packs/day: 0.50  ?  Years: 15.00  ?  Pack years: 7.50  ?  Types: Cigarettes  ? Smokeless tobacco: Never  ?Vaping Use  ? Vaping Use: Never used  ?Substance and Sexual Activity  ? Alcohol use: Yes  ?  Alcohol/week: 5.0 - 6.0 standard drinks  ?  Types: 5 - 6 Standard drinks or equivalent per week  ?  Comment: 2-3 Beers Daily  ? Drug use: No  ? Sexual activity: Yes  ?Other Topics Concern  ? Not on file  ?Social History Narrative  ? Not on file  ? ?Social Determinants of Health  ? ?Financial Resource Strain: Not on file  ?Food Insecurity: Not on file  ?Transportation Needs: Not on file  ?Physical Activity: Not on file  ?Stress: Not on file  ?Social Connections: Not on file  ?Intimate Partner Violence: Not on file  ? ?Family History  ?Problem Relation Age of Onset  ? Diabetes Mother   ? Hypertension Mother   ? Heart disease Mother   ? Heart failure Mother   ? Healthy Father   ? Healthy Sister   ? Diabetes Maternal Aunt   ? Diabetes Cousin   ? ?Current Outpatient Medications on File Prior  to Visit  ?Medication Sig  ? cetirizine (ZYRTEC) 10 MG tablet Take 10 mg by mouth at bedtime.  ? chlorhexidine (PERIDEX) 0.12 % solution 15 mLs 2 (two) times daily.  ? EPINEPHrine 0.3 mg/0.3 mL IJ SOAJ injection Inject into the muscle.  ? famotidine (PEPCID) 40 MG tablet Take 40 mg by mouth 2 (two) times daily.  ? ?No current facility-administered medications on file prior to visit.  ? ? ?Review of Systems ?Per HPI unless specifically indicated above ? ? ?   ?Objective:  ?  ?BP (!) 146/88 (BP Location: Left Arm, Cuff Size: Normal)   Pulse (!) 101   Ht '5\' 10"'$  (1.778 m)   Wt 216 lb 12.8 oz (98.3 kg)   BMI 31.11 kg/m?   ?Wt Readings from Last 3 Encounters:  ?08/05/21 216 lb 12.8 oz (98.3 kg)  ?08/17/20  205 lb (93 kg)  ?12/21/19 220 lb 0.3 oz (99.8 kg)  ?  ?Physical Exam ?Vitals and nursing note reviewed.  ?Constitutional:   ?   General: He is not in acute distress. ?   Appearance: Normal appearance. He is well-developed. He is not diaphoretic.  ?   Comments: Well-appearing, comfortable, cooperative  ?HENT:  ?   Head: Normocephalic and atraumatic.  ?Eyes:  ?   General:     ?   Right eye: No discharge.     ?   Left eye: No discharge.  ?   Conjunctiva/sclera: Conjunctivae normal.  ?Cardiovascular:  ?   Rate and Rhythm: Normal rate.  ?Pulmonary:  ?   Effort: Pulmonary effort is normal.  ?Skin: ?   General: Skin is warm and dry.  ?   Findings: No erythema or rash.  ?Neurological:  ?   Mental Status: He is alert and oriented to person, place, and time.  ?Psychiatric:     ?   Mood and Affect: Mood normal.     ?   Behavior: Behavior normal.     ?   Thought Content: Thought content normal.  ?   Comments: Well groomed, good eye contact, normal speech and thoughts  ? ? ? ?Results for orders placed or performed during the hospital encounter of 08/17/20  ?CBC with Differential  ?Result Value Ref Range  ? WBC 12.4 (H) 4.0 - 10.5 K/uL  ? RBC 5.27 4.22 - 5.81 MIL/uL  ? Hemoglobin 14.4 13.0 - 17.0 g/dL  ? HCT 40.2 39.0 - 52.0 %  ? MCV 76.3 (L) 80.0 - 100.0 fL  ? MCH 27.3 26.0 - 34.0 pg  ? MCHC 35.8 30.0 - 36.0 g/dL  ? RDW 14.8 11.5 - 15.5 %  ? Platelets 191 150 - 400 K/uL  ? nRBC 0.0 0.0 - 0.2 %  ? Neutrophils Relative % 54 %  ? Neutro Abs 6.6 1.7 - 7.7 K/uL  ? Lymphocytes Relative 33 %  ? Lymphs Abs 4.1 (H) 0.7 - 4.0 K/uL  ? Monocytes Relative 11 %  ? Monocytes Absolute 1.3 (H) 0.1 - 1.0 K/uL  ? Eosinophils Relative 2 %  ? Eosinophils Absolute 0.3 0.0 - 0.5 K/uL  ? Basophils Relative 0 %  ? Basophils Absolute 0.0 0.0 - 0.1 K/uL  ? Immature Granulocytes 0 %  ? Abs Immature Granulocytes 0.04 0.00 - 0.07 K/uL  ?Basic metabolic panel  ?Result Value Ref Range  ? Sodium 138 135 - 145 mmol/L  ? Potassium 3.8 3.5 - 5.1 mmol/L  ? Chloride  104 98 - 111 mmol/L  ? CO2 24  22 - 32 mmol/L  ? Glucose, Bld 101 (H) 70 - 99 mg/dL  ? BUN 13 6 - 20 mg/dL  ? Creatinine, Ser 0.93 0.61 - 1.24 mg/dL  ? Calcium 8.9 8.9 - 10.3 mg/dL  ? GFR, Estimated >60 >60 mL/min  ? Anion gap 10 5 - 15  ? ?   ?Assessment & Plan:  ? ?Problem List Items Addressed This Visit   ? ? Essential hypertension - Primary  ? Relevant Medications  ? EPINEPHrine 0.3 mg/0.3 mL IJ SOAJ injection  ? amLODipine (NORVASC) 10 MG tablet  ? sildenafil (REVATIO) 20 MG tablet  ? ?Other Visit Diagnoses   ? ? Encounter to establish care with new doctor      ? Obesity (BMI 30.0-34.9)      ? Screening examination for STD (sexually transmitted disease)      ? Relevant Orders  ? GC/Chlamydia probe amp ([place])not at [facility]  ? HIV Antibody (routine testing w rflx)  ? RPR  ? Acute conjunctivitis of both eyes, unspecified acute conjunctivitis type      ? Relevant Medications  ? trimethoprim-polymyxin b (POLYTRIM) ophthalmic solution  ? Erectile dysfunction, unspecified erectile dysfunction type      ? Relevant Medications  ? sildenafil (REVATIO) 20 MG tablet  ? ?  ?  ?Mildly elevated initial BP, repeat manual check improved. ?- Home BP readings limited  ? ?Plan:  ? ?Start checking Blood Pressure readings at home, usually resting for 5-10 minutes prior to check, morning or evening, ? ?Goal reading < 135 / 85 (perfect) - definitely needs to be lower than 140 / 90 - either number or both. ? ?Start medication if you see consistent results 4-5 days or more out of a week with higher reading. ?- Amlodipine '10mg'$  tabs, if you can start with HALF tablet for '5mg'$  for first 1-2 weeks. Then go to full tablet. ? ?If readings are mostly in range, 1-2 readings max elevated, then the rest normal, you can hold off on the medicine ? ? ?Conjunctivitis ?Acute, likely viral vs allergic trigger ?No obvious sign of bacterial conjunctivitis ?Rx Polytrim eye drops ? ?STD Screening - labs today ? ?ED ?Gradual chronic problem, likely HTN  related, discussed substances impacting it as well. ?Less likely testosterone but we can check in future ?Order Sildneafil dosing as advised, goodrx coupon ? ? ?Meds ordered this encounter  ?Medications  ? amLO

## 2021-08-06 LAB — RPR: RPR Ser Ql: NONREACTIVE

## 2021-08-06 LAB — HIV ANTIBODY (ROUTINE TESTING W REFLEX): HIV 1&2 Ab, 4th Generation: NONREACTIVE

## 2021-08-09 DIAGNOSIS — S0502XA Injury of conjunctiva and corneal abrasion without foreign body, left eye, initial encounter: Secondary | ICD-10-CM | POA: Diagnosis not present

## 2021-08-26 ENCOUNTER — Encounter: Payer: Self-pay | Admitting: Family Medicine

## 2021-09-11 ENCOUNTER — Other Ambulatory Visit: Payer: Managed Care, Other (non HMO)

## 2021-09-13 ENCOUNTER — Other Ambulatory Visit: Payer: Managed Care, Other (non HMO)

## 2021-09-13 DIAGNOSIS — Z833 Family history of diabetes mellitus: Secondary | ICD-10-CM

## 2021-09-13 DIAGNOSIS — Z13220 Encounter for screening for lipoid disorders: Secondary | ICD-10-CM | POA: Diagnosis not present

## 2021-09-13 DIAGNOSIS — Z1159 Encounter for screening for other viral diseases: Secondary | ICD-10-CM

## 2021-09-13 DIAGNOSIS — I10 Essential (primary) hypertension: Secondary | ICD-10-CM

## 2021-09-13 DIAGNOSIS — Z125 Encounter for screening for malignant neoplasm of prostate: Secondary | ICD-10-CM

## 2021-09-13 DIAGNOSIS — Z0000 Encounter for general adult medical examination without abnormal findings: Secondary | ICD-10-CM

## 2021-09-13 DIAGNOSIS — E669 Obesity, unspecified: Secondary | ICD-10-CM

## 2021-09-15 LAB — CBC WITH DIFFERENTIAL/PLATELET
Absolute Monocytes: 1220 cells/uL — ABNORMAL HIGH (ref 200–950)
Basophils Absolute: 32 cells/uL (ref 0–200)
Basophils Relative: 0.3 %
Eosinophils Absolute: 182 cells/uL (ref 15–500)
Eosinophils Relative: 1.7 %
HCT: 43.7 % (ref 38.5–50.0)
Hemoglobin: 15 g/dL (ref 13.2–17.1)
Lymphs Abs: 3585 cells/uL (ref 850–3900)
MCH: 28.2 pg (ref 27.0–33.0)
MCHC: 34.3 g/dL (ref 32.0–36.0)
MCV: 82.1 fL (ref 80.0–100.0)
MPV: 10.6 fL (ref 7.5–12.5)
Monocytes Relative: 11.4 %
Neutro Abs: 5682 cells/uL (ref 1500–7800)
Neutrophils Relative %: 53.1 %
Platelets: 295 10*3/uL (ref 140–400)
RBC: 5.32 10*6/uL (ref 4.20–5.80)
RDW: 14.8 % (ref 11.0–15.0)
Total Lymphocyte: 33.5 %
WBC: 10.7 10*3/uL (ref 3.8–10.8)

## 2021-09-15 LAB — HEMOGLOBIN A1C
Hgb A1c MFr Bld: 5.8 % of total Hgb — ABNORMAL HIGH (ref <5.7)
Mean Plasma Glucose: 120 mg/dL
eAG (mmol/L): 6.6 mmol/L

## 2021-09-15 LAB — COMPLETE METABOLIC PANEL WITH GFR
AG Ratio: 1 (calc) (ref 1.0–2.5)
ALT: 18 U/L (ref 9–46)
AST: 18 U/L (ref 10–40)
Albumin: 3.8 g/dL (ref 3.6–5.1)
Alkaline phosphatase (APISO): 72 U/L (ref 36–130)
BUN: 12 mg/dL (ref 7–25)
CO2: 27 mmol/L (ref 20–32)
Calcium: 8.7 mg/dL (ref 8.6–10.3)
Chloride: 101 mmol/L (ref 98–110)
Creat: 0.81 mg/dL (ref 0.60–1.29)
Globulin: 3.9 g/dL (calc) — ABNORMAL HIGH (ref 1.9–3.7)
Glucose, Bld: 96 mg/dL (ref 65–99)
Potassium: 4.1 mmol/L (ref 3.5–5.3)
Sodium: 137 mmol/L (ref 135–146)
Total Bilirubin: 0.6 mg/dL (ref 0.2–1.2)
Total Protein: 7.7 g/dL (ref 6.1–8.1)
eGFR: 108 mL/min/{1.73_m2} (ref >=60)

## 2021-09-15 LAB — LIPID PANEL
Cholesterol: 128 mg/dL (ref <200)
HDL: 37 mg/dL — ABNORMAL LOW (ref >=40)
LDL Cholesterol (Calc): 72 mg/dL (calc)
Non-HDL Cholesterol (Calc): 91 mg/dL (calc) (ref <130)
Total CHOL/HDL Ratio: 3.5 (calc) (ref <5.0)
Triglycerides: 100 mg/dL (ref <150)

## 2021-09-15 LAB — HEPATITIS C ANTIBODY
Hepatitis C Ab: NONREACTIVE
SIGNAL TO CUT-OFF: 0.23 (ref <1.00)

## 2021-09-15 LAB — PSA: PSA: 0.22 ng/mL (ref <=4.00)

## 2021-09-15 LAB — TSH: TSH: 3.15 mIU/L (ref 0.40–4.50)

## 2021-09-18 ENCOUNTER — Other Ambulatory Visit: Payer: Self-pay | Admitting: Family Medicine

## 2021-09-18 ENCOUNTER — Ambulatory Visit (INDEPENDENT_AMBULATORY_CARE_PROVIDER_SITE_OTHER): Payer: BC Managed Care – PPO | Admitting: Family Medicine

## 2021-09-18 ENCOUNTER — Encounter: Payer: Self-pay | Admitting: Family Medicine

## 2021-09-18 VITALS — BP 144/82 | HR 101 | Ht 70.0 in | Wt 220.0 lb

## 2021-09-18 DIAGNOSIS — G8929 Other chronic pain: Secondary | ICD-10-CM

## 2021-09-18 DIAGNOSIS — M159 Polyosteoarthritis, unspecified: Secondary | ICD-10-CM

## 2021-09-18 DIAGNOSIS — E669 Obesity, unspecified: Secondary | ICD-10-CM | POA: Diagnosis not present

## 2021-09-18 DIAGNOSIS — Z23 Encounter for immunization: Secondary | ICD-10-CM | POA: Diagnosis not present

## 2021-09-18 DIAGNOSIS — Z0000 Encounter for general adult medical examination without abnormal findings: Secondary | ICD-10-CM

## 2021-09-18 DIAGNOSIS — I10 Essential (primary) hypertension: Secondary | ICD-10-CM | POA: Diagnosis not present

## 2021-09-18 DIAGNOSIS — Z1211 Encounter for screening for malignant neoplasm of colon: Secondary | ICD-10-CM

## 2021-09-18 DIAGNOSIS — K047 Periapical abscess without sinus: Secondary | ICD-10-CM

## 2021-09-18 MED ORDER — AMOXICILLIN-POT CLAVULANATE 875-125 MG PO TABS: 1.0000 | ORAL_TABLET | Freq: Two times a day (BID) | ORAL | 1 refills | Status: DC

## 2021-09-18 NOTE — Patient Instructions (Addendum)
Thank you for coming to the office today. ? ?Referral to GI for colonoscopy ? ?[facility]) ?[address], [place] [address] ?Phone: [phone] ? ?PSA prostate screening is negative ? ?Cholesterol looks great ? ?Recent Labs  ?  09/13/21 ?0752  ?HGBA1C 5.8*  ? ?Improved blood sugar ? ?Recommend to start taking Tylenol Extra Strength '500mg'$  tabs - take 1 to 2 tabs per dose (max '1000mg'$ ) every 6-8 hours for pain (take regularly, don't skip a dose for next 7 days), max 24 hour daily dose is 6 tablets or '3000mg'$ . In the future you can repeat the same everyday Tylenol course for 1-2 weeks at a time.  ? ?START anti inflammatory topical - OTC Voltaren (generic Diclofenac) topical 2-4 times a day as needed for pain swelling of affected joint for 1-2 weeks or longer. ? ? ?Please schedule a Follow-up Appointment to: Return in about 4 weeks (around 10/16/2021) for Arthritis as needed after X-rays. ? ?If you have any other questions or concerns, please feel free to call the office or send a message through [facility]. You may also schedule an earlier appointment if necessary. ? ?Additionally, you may be receiving a survey about your experience at our office within a few days to 1 week by e-mail or mail. We value your feedback. ? ?Nobie Putnam, DO ?[facility] ?

## 2021-09-18 NOTE — Progress Notes (Signed)
le

## 2021-09-18 NOTE — Progress Notes (Signed)
? ?Subjective:  ? ? Patient ID: Aaron Woodard, male    DOB: [birth year], 51 y.o.   MRN: [id number] ? ?Aaron Woodard is a 51 y.o. male presenting on 09/18/2021 for Annual Exam ? ? ?HPI ? ?Here for Annual Physical and Lab Review. ?  ?Family history of Diabetes, mother and maternal aunts and cousin. ?Family history of heart disease. ?   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 ?Avoid NSAIDs OTC ?Now takes Tylenol only PRN ?They are going to offer allergy shots in future ?  ?CHRONIC HTN: ?Improved BP control on new medicine Amlodipine previously '5mg'$  now up to '10mg'$  daily recently increased 1 week ago. ?Home BP readings 130/80 ?Current Meds - Amlodipine '10mg'$  daily ?No side effect or issue ?Denies CP, dyspnea, HA, edema, dizziness / lightheadedness ?  ?Elevated A1c ?Last lab A1c 5.8, improved prior 6.1 ? ?Additional complaint ?Osteoarthritis / joint pain ?Hips, knees back neck multiple areas. No prior imaging or x-ray ?Daily aches and pains - Tylenol doesn't work, and cannot have NSAID.  ?Admits feels stiff and sore then improve if move ? ?Needs antibiotic ?Upcoming oral procedure/surgery ?He says it was delayed due to high blood pressure ? ?Health Maintenance: ? ?Prostate CA Screening: Last PSA 0.22 (08/2021 negative). Currently asymptomatic without BPH. No known family history of prostate CA.  ? ?Tdap ? ? ?  09/18/2021  ?  3:39 PM 08/05/2021  ? 10:16 AM  ?Depression screen PHQ 2/9  ?Decreased Interest 0 0  ?Down, Depressed, Hopeless 0 0  ?PHQ - 2 Score 0 0  ?Altered sleeping 0 0  ?Tired, decreased energy 1 1  ?Change in appetite 0 0  ?Feeling bad or failure about yourself  0 0  ?Trouble concentrating 0 0  ?Moving slowly or fidgety/restless 0 0  ?Suicidal thoughts 0 0  ?PHQ-9 Score 1 1  ?Difficult  doing work/chores Not difficult at all Not difficult at all  ? ? ?Past Medical History:  ?Diagnosis Date  ? Acute appendicitis with localized peritonitis   ? Allergy   ? Appendicitis, acute 10/04/2016  ? ?Past Surgical History:  ?Procedure Laterality Date  ? LAPAROSCOPIC APPENDECTOMY N/A 10/04/2016  ? Procedure: APPENDECTOMY LAPAROSCOPIC;  Surgeon: Florene Glen, MD;  Location: [facility] ORS;  Service: General;  Laterality: N/A;  ? ?Social History  ? ?Socioeconomic History  ? Marital status: Married  ?  Spouse name: Not on file  ? Number of children: Not on file  ? Years of education: Not on file  ? Highest education level: Not on file  ?Occupational History  ? Not on file  ?Tobacco Use  ? Smoking status: Every Day  ?  Packs/day: 0.50  ?  Years: 15.00  ?  Pack years: 7.50  ?  Types: Cigarettes  ? Smokeless tobacco: Never  ?Vaping Use  ? Vaping Use: Never used  ?Substance and Sexual Activity  ? Alcohol use: Yes  ?  Alcohol/week: 5.0 - 6.0 standard drinks  ?  Types: 5 - 6 Standard drinks or equivalent per week  ?  Comment: 2-3 Beers Daily  ? Drug use: No  ? Sexual activity: Yes  ?Other Topics Concern  ? Not on file  ?Social History Narrative  ? Not on file  ? ?Social Determinants of Health  ? ?Financial  Resource Strain: Not on file  ?Food Insecurity: Not on file  ?Transportation Needs: Not on file  ?Physical Activity: Not on file  ?Stress: Not on file  ?Social Connections: Not on file  ?Intimate Partner Violence: Not on file  ? ?Family History  ?Problem Relation Age of Onset  ? Diabetes Mother   ? Hypertension Mother   ? Heart disease Mother   ? Heart failure Mother   ? Healthy Father   ? Healthy Sister   ? Diabetes Maternal Aunt   ? Diabetes Cousin   ? ? ?Current Outpatient Medications on File Prior to Visit  ?Medication Sig  ? amLODipine (NORVASC) 10 MG tablet Take 1 tablet (10 mg total) by mouth daily.  ? cetirizine (ZYRTEC) 10 MG tablet Take 10 mg by mouth at bedtime.  ? chlorhexidine (PERIDEX) 0.12 % solution 15  mLs 2 (two) times daily.  ? EPINEPHrine 0.3 mg/0.3 mL IJ SOAJ injection Inject into the muscle.  ? famotidine (PEPCID) 40 MG tablet Take 40 mg by mouth 2 (two) times daily.  ? sildenafil (REVATIO) 20 MG tablet Take 1-5 pills about 30 min prior to sex. Start with 1 and increase as needed.  ? ?No current facility-administered medications on file prior to visit.  ? ? ?Review of Systems  ?Constitutional:  Negative for activity change, appetite change, chills, diaphoresis, fatigue and fever.  ?HENT:  Negative for congestion and hearing loss.   ?Eyes:  Negative for visual disturbance.  ?Respiratory:  Negative for cough, chest tightness, shortness of breath and wheezing.   ?Cardiovascular:  Negative for chest pain, palpitations and leg swelling.  ?Gastrointestinal:  Negative for abdominal pain, constipation, diarrhea, nausea and vomiting.  ?Genitourinary:  Negative for dysuria, frequency and hematuria.  ?Musculoskeletal:  Positive for arthralgias and back pain. Negative for neck pain.  ?Skin:  Negative for rash.  ?Neurological:  Negative for dizziness, weakness, light-headedness, numbness and headaches.  ?Hematological:  Negative for adenopathy.  ?Psychiatric/Behavioral:  Negative for behavioral problems, dysphoric mood and sleep disturbance.   ?Per HPI unless specifically indicated above ? ? ?   ?Objective:  ?  ?BP (!) 144/82   Pulse (!) 101   Ht '5\' 10"'$  (1.778 m)   Wt 220 lb (99.8 kg)   SpO2 99%   BMI 31.57 kg/m?   ?Wt Readings from Last 3 Encounters:  ?09/18/21 220 lb (99.8 kg)  ?08/05/21 216 lb 12.8 oz (98.3 kg)  ?08/17/20 205 lb (93 kg)  ?  ?Physical Exam ?Vitals and nursing note reviewed.  ?Constitutional:   ?   General: He is not in acute distress. ?   Appearance: He is well-developed. He is not diaphoretic.  ?   Comments: Well-appearing, comfortable, cooperative  ?HENT:  ?   Head: Normocephalic and atraumatic.  ?Eyes:  ?   General:     ?   Right eye: No discharge.     ?   Left eye: No discharge.  ?    Conjunctiva/sclera: Conjunctivae normal.  ?   Pupils: Pupils are equal, round, and reactive to light.  ?Neck:  ?   Thyroid: No thyromegaly.  ?Cardiovascular:  ?   Rate and Rhythm: Normal rate and regular rhythm.  ?   Pulses: Normal pulses.  ?   Heart sounds: Normal heart sounds. No murmur heard. ?Pulmonary:  ?   Effort: Pulmonary effort is normal. No respiratory distress.  ?   Breath sounds: Normal breath sounds. No wheezing or rales.  ?Abdominal:  ?   General:  Bowel sounds are normal. There is no distension.  ?   Palpations: Abdomen is soft. There is no mass.  ?   Tenderness: There is no abdominal tenderness.  ?Musculoskeletal:     ?   General: No tenderness. Normal range of motion.  ?   Cervical back: Normal range of motion and neck supple.  ?   Comments: Upper / Lower Extremities: ?- Normal muscle tone, strength bilateral upper extremities 5/5, lower extremities 5/5  ?Lymphadenopathy:  ?   Cervical: No cervical adenopathy.  ?Skin: ?   General: Skin is warm and dry.  ?   Findings: No erythema or rash.  ?Neurological:  ?   Mental Status: He is alert and oriented to person, place, and time.  ?   Comments: Distal sensation intact to light touch all extremities  ?Psychiatric:     ?   Mood and Affect: Mood normal.     ?   Behavior: Behavior normal.     ?   Thought Content: Thought content normal.  ?   Comments: Well groomed, good eye contact, normal speech and thoughts  ? ? ? ? ?Results for orders placed or performed in visit on 09/13/21  ?TSH  ?Result Value Ref Range  ? TSH 3.15 0.40 - 4.50 mIU/L  ?Hepatitis C antibody  ?Result Value Ref Range  ? Hepatitis C Ab NON-REACTIVE NON-REACTIVE  ? SIGNAL TO CUT-OFF 0.23 <1.00  ?PSA  ?Result Value Ref Range  ? PSA 0.22 < OR = 4.00 ng/mL  ?Hemoglobin A1c  ?Result Value Ref Range  ? Hgb A1c MFr Bld 5.8 (H) <5.7 % of total Hgb  ? Mean Plasma Glucose 120 mg/dL  ? eAG (mmol/L) 6.6 mmol/L  ?Lipid panel  ?Result Value Ref Range  ? Cholesterol 128 <200 mg/dL  ? HDL 37 (L) > OR = 40  mg/dL  ? Triglycerides 100 <150 mg/dL  ? LDL Cholesterol (Calc) 72 mg/dL (calc)  ? Total CHOL/HDL Ratio 3.5 <5.0 (calc)  ? Non-HDL Cholesterol (Calc) 91 <130 mg/dL (calc)  ?CBC with Differential/Platelet  ?Result Value R

## 2021-09-23 ENCOUNTER — Telehealth: Payer: Self-pay

## 2021-09-23 NOTE — Telephone Encounter (Signed)
CALLED PATIENT NO ANSWER LEFT VOICEMAIL FOR A CALL BACK °Letter sent °

## 2021-09-24 ENCOUNTER — Other Ambulatory Visit: Payer: Self-pay | Admitting: Family Medicine

## 2021-09-24 ENCOUNTER — Ambulatory Visit
Admission: RE | Admit: 2021-09-24 | Discharge: 2021-09-24 | Disposition: A | Discharge status: Home / Self Care | Payer: BC Managed Care – PPO | Source: Ambulatory Visit | Attending: Family Medicine | Admitting: Family Medicine

## 2021-09-24 ENCOUNTER — Ambulatory Visit
Admission: RE | Admit: 2021-09-24 | Discharge: 2021-09-24 | Disposition: A | Discharge status: Home / Self Care | Payer: BC Managed Care – PPO | Attending: Family Medicine | Admitting: Family Medicine

## 2021-09-24 DIAGNOSIS — M25562 Pain in left knee: Secondary | ICD-10-CM | POA: Diagnosis not present

## 2021-09-24 DIAGNOSIS — M25551 Pain in right hip: Secondary | ICD-10-CM | POA: Insufficient documentation

## 2021-09-24 DIAGNOSIS — M25552 Pain in left hip: Secondary | ICD-10-CM | POA: Diagnosis not present

## 2021-09-24 DIAGNOSIS — G8929 Other chronic pain: Secondary | ICD-10-CM | POA: Insufficient documentation

## 2021-09-24 DIAGNOSIS — M545 Low back pain, unspecified: Secondary | ICD-10-CM

## 2021-09-24 DIAGNOSIS — M159 Polyosteoarthritis, unspecified: Secondary | ICD-10-CM | POA: Insufficient documentation

## 2021-09-24 DIAGNOSIS — M25561 Pain in right knee: Secondary | ICD-10-CM | POA: Diagnosis not present

## 2021-09-24 DIAGNOSIS — M1712 Unilateral primary osteoarthritis, left knee: Secondary | ICD-10-CM | POA: Diagnosis not present

## 2021-09-26 ENCOUNTER — Telehealth: Payer: Self-pay

## 2021-09-26 ENCOUNTER — Other Ambulatory Visit: Payer: Self-pay

## 2021-09-26 DIAGNOSIS — Z1211 Encounter for screening for malignant neoplasm of colon: Secondary | ICD-10-CM

## 2021-09-26 MED ORDER — NA SULFATE-K SULFATE-MG SULF 17.5-3.13-1.6 GM/177ML PO SOLN
1.0000 | Freq: Once | ORAL | 0 refills | Status: AC
Start: 2021-09-26 — End: 2021-09-26

## 2021-09-26 NOTE — Telephone Encounter (Signed)
Gastroenterology Pre-Procedure Review ? ?Request Date: 04/28/22 ?Requesting Physician: Dr. Vicente Males ? ?PATIENT REVIEW QUESTIONS: The patient responded to the following health history questions as indicated:   ? ?1. Are you having any GI issues? no ?2. Do you have a personal history of Polyps? no ?3. Do you have a family history of Colon Cancer or Polyps? yes (father colon polyps) ?4. Diabetes Mellitus? no ?5. Joint replacements in the past 12 months?no ?6. Major health problems in the past 3 months?no ?7. Any artificial heart valves, MVP, or defibrillator?no ?   ?MEDICATIONS & ALLERGIES:    ?Patient reports the following regarding taking any anticoagulation/antiplatelet therapy:   ?Plavix, Coumadin, Eliquis, Xarelto, Lovenox, Pradaxa, Brilinta, or Effient? no ?Aspirin? no ? ?Patient confirms/reports the following medications:  ?Current Outpatient Medications  ?Medication Sig Dispense Refill  ? amLODipine (NORVASC) 10 MG tablet Take 1 tablet (10 mg total) by mouth daily. 30 tablet 2  ? amoxicillin-clavulanate (AUGMENTIN) 875-125 MG tablet Take 1 tablet by mouth 2 (two) times daily. 20 tablet 1  ? cetirizine (ZYRTEC) 10 MG tablet Take 10 mg by mouth at bedtime.    ? chlorhexidine (PERIDEX) 0.12 % solution 15 mLs 2 (two) times daily.    ? EPINEPHrine 0.3 mg/0.3 mL IJ SOAJ injection Inject into the muscle.    ? famotidine (PEPCID) 40 MG tablet Take 40 mg by mouth 2 (two) times daily.    ? sildenafil (REVATIO) 20 MG tablet Take 1-5 pills about 30 min prior to sex. Start with 1 and increase as needed. 90 tablet 3  ? ?No current facility-administered medications for this visit.  ? ? ?Patient confirms/reports the following allergies:  ?Allergies  ?Allergen Reactions  ? 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 Nsaids Swelling  ? Pork-Derived Products Swelling  ? ? ?No orders of the defined types were placed in this encounter. ? ? ?AUTHORIZATION INFORMATION ?Primary Insurance: ?1D#: ?Group #: ? ?Secondary Insurance: ?1D#: ?Group  #: ? ?SCHEDULE INFORMATION: ?Date: 04/28/22 ?Time: ?Location: [facility] ?

## 2021-10-01 ENCOUNTER — Encounter: Payer: Self-pay | Admitting: Family Medicine

## 2021-10-01 ENCOUNTER — Ambulatory Visit (INDEPENDENT_AMBULATORY_CARE_PROVIDER_SITE_OTHER): Payer: BC Managed Care – PPO | Admitting: Family Medicine

## 2021-10-01 VITALS — BP 140/80 | HR 97 | Ht 70.0 in | Wt 214.8 lb

## 2021-10-01 DIAGNOSIS — M545 Low back pain, unspecified: Secondary | ICD-10-CM

## 2021-10-01 DIAGNOSIS — M25561 Pain in right knee: Secondary | ICD-10-CM | POA: Diagnosis not present

## 2021-10-01 DIAGNOSIS — M25551 Pain in right hip: Secondary | ICD-10-CM

## 2021-10-01 DIAGNOSIS — M25552 Pain in left hip: Secondary | ICD-10-CM

## 2021-10-01 DIAGNOSIS — G8929 Other chronic pain: Secondary | ICD-10-CM

## 2021-10-01 DIAGNOSIS — M25562 Pain in left knee: Secondary | ICD-10-CM

## 2021-10-01 DIAGNOSIS — M159 Polyosteoarthritis, unspecified: Secondary | ICD-10-CM

## 2021-10-01 MED ORDER — GABAPENTIN 100 MG PO CAPS: ORAL_CAPSULE | ORAL | 2 refills | Status: DC

## 2021-10-01 MED ORDER — BACLOFEN 10 MG PO TABS: 5.0000 mg | ORAL_TABLET | Freq: Three times a day (TID) | ORAL | 2 refills | Status: DC | PRN

## 2021-10-01 NOTE — Progress Notes (Signed)
? ?Subjective:  ? ? Patient ID: Aaron Woodard, male    DOB: 16-May-1971, 51 y.o.   MRN: [id number] ? ?Aaron Woodard is a 51 y.o. male presenting on 10/01/2021 for Back Pain ? ? ?HPI ? ?Osteoarthritis / joint pain ?Chronic Low Back Pain ?Knee Pain ?HIp Pain ? ?Hips, knees back neck multiple areas. No prior imaging or x-ray ?Daily aches and pains - Tylenol 1033m TID eases some pain but doesn't resolve, and cannot have NSAID, due to swelling  allergic reaction he has had in the past. ?Admits feels stiff and sore then improve if move ? ?X-ray results below. ? ?He has chronic back and other joint pain, describes stiffness soreness worse when prolong sit or fixed position then has to get up.  ?He remains physically active ?No acute trauma or injury ? ? ? ?  09/18/2021  ?  3:39 PM 08/05/2021  ? 10:16 AM  ?Depression screen PHQ 2/9  ?Decreased Interest 0 0  ?Down, Depressed, Hopeless 0 0  ?PHQ - 2 Score 0 0  ?Altered sleeping 0 0  ?Tired, decreased energy 1 1  ?Change in appetite 0 0  ?Feeling bad or failure about yourself  0 0  ?Trouble concentrating 0 0  ?Moving slowly or fidgety/restless 0 0  ?Suicidal thoughts 0 0  ?PHQ-9 Score 1 1  ?Difficult doing work/chores Not difficult at all Not difficult at all  ? ? ?Social History  ? ?Tobacco Use  ? Smoking status: Every Day  ?  Packs/day: 0.50  ?  Years: 15.00  ?  Pack years: 7.50  ?  Types: Cigarettes  ? Smokeless tobacco: Never  ?Vaping Use  ? Vaping Use: Never used  ?Substance Use Topics  ? Alcohol use: Yes  ?  Alcohol/week: 5.0 - 6.0 standard drinks  ?  Types: 5 - 6 Standard drinks or equivalent per week  ?  Comment: 2-3 Beers Daily  ? Drug use: No  ? ? ?Review of Systems ?Per HPI unless specifically indicated above ? ?   ?Objective:  ?  ?BP 140/80   Pulse 97   Ht 5' 10"  (1.778 m)   Wt 214 lb 12.8 oz (97.4 kg)   SpO2 99%   BMI 30.82 kg/m?   ?Wt Readings from Last 3 Encounters:  ?10/01/21 214 lb 12.8 oz (97.4 kg)  ?09/18/21 220 lb (99.8 kg)  ?08/05/21 216 lb 12.8 oz (98.3  kg)  ?  ?Physical Exam ?Vitals and nursing note reviewed.  ?Constitutional:   ?   General: He is not in acute distress. ?   Appearance: Normal appearance. He is well-developed. He is not diaphoretic.  ?   Comments: Well-appearing, comfortable, cooperative  ?HENT:  ?   Head: Normocephalic and atraumatic.  ?Eyes:  ?   General:     ?   Right eye: No discharge.     ?   Left eye: No discharge.  ?   Conjunctiva/sclera: Conjunctivae normal.  ?Cardiovascular:  ?   Rate and Rhythm: Normal rate.  ?Pulmonary:  ?   Effort: Pulmonary effort is normal.  ?Skin: ?   General: Skin is warm and dry.  ?   Findings: No erythema or rash.  ?Neurological:  ?   Mental Status: He is alert and oriented to person, place, and time.  ?Psychiatric:     ?   Mood and Affect: Mood normal.     ?   Behavior: Behavior normal.     ?   Thought Content: Thought  content normal.  ?   Comments: Well groomed, good eye contact, normal speech and thoughts  ? ? ?I have personally reviewed the radiology report from 09/24/21 on X-rays for Lumbar, L knee, and R Hip ? ?CLINICAL DATA:  Right-sided lower back pain. ?  ?EXAM: ?LUMBAR SPINE - COMPLETE 4+ VIEW ?  ?COMPARISON:  CT abdomen and pelvis 10/04/2016 ?  ?FINDINGS: ?There are 5 non-rib-bearing lumbar-type vertebral bodies. Tiny ribs ?at the T12 level. There is again a transitional left L5-S1 ?assimilation joint. ?  ?Surgical clips overlie the superior right ilium. ?  ?Moderate to high-grade bilateral sacroiliac subchondral sclerosis. ?  ?Vertebral body heights are maintained. ?  ?Normal sagittal alignment. ?  ?Disc spaces are preserved. Mild anterior L1-2 through L4-5 endplate ?osteophytes. Anterior T12-L1 degenerative bridging osteophytes. ?  ?IMPRESSION:: ?IMPRESSION: ?1. Mild anterior endplate osteophytes throughout the lumbar spine. ?Disc spaces are preserved. ?2. There is again symmetric moderate to high-grade bilateral ?sacroiliac subchondral sclerosis as can be seen with sacroiliitis. ?This can be seen in  the setting of ankylosing spondylitis and ?inflammatory bowel disease. ?  ?  ?Electronically Signed ?  By: Yvonne Kendall M.D. ?  On: 09/25/2021 15:31 ? ? ?---- ? ? ?CLINICAL DATA:  Chronic bilateral knee pain with suspected ?arthritis. ?  ?EXAM: ?LEFT KNEE - COMPLETE 4+ VIEW ?  ?COMPARISON:  None Available. ?  ?FINDINGS: ?Normal bone mineralization. No joint effusion. Minimal chronic ?enthesopathic change at the quadriceps insertion on the patella. ?Mild medial and lateral femoral condyle intercondylar notch ?degenerative spurring. Joint spaces are preserved. No acute fracture ?or dislocation. ?  ?IMPRESSION: ?Minimal intercondylar notch osteoarthritis. ?  ?  ?Electronically Signed ?  By: Yvonne Kendall M.D. ?  On: 09/25/2021 15:01 ? ?--- ? ?CLINICAL DATA:  Chronic bilateral hip pain with stiffness for 1 ?month. Clinical concern for arthritis. ?  ?EXAM: ?DG HIP (WITH OR WITHOUT PELVIS) 2-3V RIGHT ?  ?COMPARISON:  CT abdomen and pelvis 10/04/2016 ?  ?FINDINGS: ?Mild-to-moderate left and mild right femoroacetabular joint space ?narrowing. Moderate bilateral femoral head-neck junction ?circumferential degenerative osteophytes. Left-greater-than-right ?superolateral acetabular degenerative cystic change. The pubic ?symphysis joint space is maintained. Moderate to high-grade ?symmetric sclerosis on the iliac greater than sacral sides of the ?bilateral sacroiliac joints with mild bilateral joint space ?narrowing. Surgical clips overlie the superior right ilium. No acute ?fracture or dislocation. ?  ?IMPRESSION:: ?IMPRESSION: ?1. There is again symmetric moderate to high-grade bilateral ?sacroiliac subchondral sclerosis as can be seen with sacroiliitis. ?This can be seen in the setting of ankylosing spondylitis and ?inflammatory bowel disease. ?2. Moderate bilateral femoroacetabular osteoarthritis. ?  ?  ?Electronically Signed ?  By: Yvonne Kendall M.D. ?  On: 09/25/2021 15:27 ? ? ?Results for orders placed or performed in  visit on 09/13/21  ?TSH  ?Result Value Ref Range  ? TSH 3.15 0.40 - 4.50 mIU/L  ?Hepatitis C antibody  ?Result Value Ref Range  ? Hepatitis C Ab NON-REACTIVE NON-REACTIVE  ? SIGNAL TO CUT-OFF 0.23 <1.00  ?PSA  ?Result Value Ref Range  ? PSA 0.22 < OR = 4.00 ng/mL  ?Hemoglobin A1c  ?Result Value Ref Range  ? Hgb A1c MFr Bld 5.8 (H) <5.7 % of total Hgb  ? Mean Plasma Glucose 120 mg/dL  ? eAG (mmol/L) 6.6 mmol/L  ?Lipid panel  ?Result Value Ref Range  ? Cholesterol 128 <200 mg/dL  ? HDL 37 (L) > OR = 40 mg/dL  ? Triglycerides 100 <150 mg/dL  ? LDL Cholesterol (Calc)  72 mg/dL (calc)  ? Total CHOL/HDL Ratio 3.5 <5.0 (calc)  ? Non-HDL Cholesterol (Calc) 91 <130 mg/dL (calc)  ?CBC with Differential/Platelet  ?Result Value Ref Range  ? WBC 10.7 3.8 - 10.8 Thousand/uL  ? RBC 5.32 4.20 - 5.80 Million/uL  ? Hemoglobin 15.0 13.2 - 17.1 g/dL  ? HCT 43.7 38.5 - 50.0 %  ? MCV 82.1 80.0 - 100.0 fL  ? MCH 28.2 27.0 - 33.0 pg  ? MCHC 34.3 32.0 - 36.0 g/dL  ? RDW 14.8 11.0 - 15.0 %  ? Platelets 295 140 - 400 Thousand/uL  ? MPV 10.6 7.5 - 12.5 fL  ? Neutro Abs 5,682 1,500 - 7,800 cells/uL  ? Lymphs Abs 3,585 850 - 3,900 cells/uL  ? Absolute Monocytes 1,220 (H) 200 - 950 cells/uL  ? Eosinophils Absolute 182 15 - 500 cells/uL  ? Basophils Absolute 32 0 - 200 cells/uL  ? Neutrophils Relative % 53.1 %  ? Total Lymphocyte 33.5 %  ? Monocytes Relative 11.4 %  ? Eosinophils Relative 1.7 %  ? Basophils Relative 0.3 %  ?COMPLETE METABOLIC PANEL WITH GFR  ?Result Value Ref Range  ? Glucose, Bld 96 65 - 99 mg/dL  ? BUN 12 7 - 25 mg/dL  ? Creat 0.81 0.60 - 1.29 mg/dL  ? eGFR 108 > OR = 60 mL/min/1.54m  ? BUN/Creatinine Ratio NOT APPLICABLE 6 - 22 (calc)  ? Sodium 137 135 - 146 mmol/L  ? Potassium 4.1 3.5 - 5.3 mmol/L  ? Chloride 101 98 - 110 mmol/L  ? CO2 27 20 - 32 mmol/L  ? Calcium 8.7 8.6 - 10.3 mg/dL  ? Total Protein 7.7 6.1 - 8.1 g/dL  ? Albumin 3.8 3.6 - 5.1 g/dL  ? Globulin 3.9 (H) 1.9 - 3.7 g/dL (calc)  ? AG Ratio 1.0 1.0 - 2.5 (calc)  ?  Total Bilirubin 0.6 0.2 - 1.2 mg/dL  ? Alkaline phosphatase (APISO) 72 36 - 130 U/L  ? AST 18 10 - 40 U/L  ? ALT 18 9 - 46 U/L  ? ?   ?Assessment & Plan:  ? ?Problem List Items Addressed This Visit   ?None ?V

## 2021-10-01 NOTE — Patient Instructions (Addendum)
Thank you for coming to the office today. ? ?Likely all back and muscle joint pain from the osteoarthritis symptoms. ? ?Keep on Tylenol in the future 500 mg x 2 = '1000mg'$  3 times a day as well to keep pain under control in manageable level. ? ?Start taking Baclofen (Lioresal) '10mg'$  (muscle relaxant) - start with half (cut) to one whole pill at night as needed for next 1-3 nights (may make you drowsy, caution with driving) see how it affects you, then if tolerated increase to one pill 2 to 3 times a day or (every 8 hours as needed) ? ?Start Gabapentin '100mg'$  capsules, take at night for 2-3 nights only, and then increase to 2 times a day for a few days, and then may increase to 3 times a day, it may make you drowsy, if helps significantly at night only, then you can increase instead to 3 capsules at night, instead of 3 times a day ? ?- In the future if needed, we can significantly increase the dose if tolerated well, some common doses are '300mg'$  three times a day up to '600mg'$  three times a day, usually it takes several weeks or months to get to higher doses ? ?Future we can refer to Orthopedic or Physical therapy if need. ? ?Please schedule a Follow-up Appointment to: Return in about 3 months (around 01/01/2022) for 3 month follow-up Arthritis / Pain, HTN . ? ?If you have any other questions or concerns, please feel free to call the office or send a message through [place]. You may also schedule an earlier appointment if necessary. ? ?Additionally, you may be receiving a survey about your experience at our office within a few days to 1 week by e-mail or mail. We value your feedback. ? ?Nobie Putnam, DO ?[place] ?

## 2021-10-02 ENCOUNTER — Encounter: Payer: Self-pay | Admitting: Family Medicine

## 2021-10-17 ENCOUNTER — Encounter: Payer: Self-pay | Admitting: Family Medicine

## 2021-10-28 ENCOUNTER — Other Ambulatory Visit: Payer: Self-pay | Admitting: Family Medicine

## 2021-10-28 DIAGNOSIS — Z87892 Personal history of anaphylaxis: Secondary | ICD-10-CM

## 2021-10-29 ENCOUNTER — Other Ambulatory Visit: Payer: Self-pay

## 2021-10-29 MED ORDER — EPINEPHRINE 0.3 MG/0.3ML IJ SOAJ
0.3000 mg | INTRAMUSCULAR | 3 refills | Status: AC | PRN
  Filled 2021-10-29: qty 2, 30d supply, fill #0
  Filled 2021-12-28: qty 2, 30d supply, fill #1
  Filled 2022-08-07: qty 2, 30d supply, fill #2

## 2021-10-29 MED ORDER — CHLORHEXIDINE GLUCONATE 0.12 % MT SOLN
15.0000 mL | Freq: Two times a day (BID) | OROMUCOSAL | 0 refills | Status: DC
  Filled 2021-10-29: qty 473, 16d supply, fill #0

## 2021-10-31 ENCOUNTER — Other Ambulatory Visit: Payer: Self-pay | Admitting: Family Medicine

## 2021-10-31 DIAGNOSIS — I10 Essential (primary) hypertension: Secondary | ICD-10-CM

## 2021-10-31 NOTE — Telephone Encounter (Signed)
Requested Prescriptions  Pending Prescriptions Disp Refills  . amLODipine (NORVASC) 10 MG tablet [Pharmacy Med Name: AMLODIPINE BESYLATE 10 MG TAB] 90 tablet 1    Sig: TAKE 1 TABLET BY MOUTH EVERY DAY     Cardiovascular: Calcium Channel Blockers 2 Failed - 10/31/2021  1:52 AM      Failed - Last BP in normal range    BP Readings from Last 1 Encounters:  10/01/21 140/80         Passed - Last Heart Rate in normal range    Pulse Readings from Last 1 Encounters:  10/01/21 97         Passed - Valid encounter within last 6 months    Recent Outpatient Visits          1 month ago Primary osteoarthritis involving multiple joints   [place], DO   1 month ago Annual physical exam   [facility] Olin Hauser, DO   2 months ago Essential hypertension   [place], DO      Future Appointments            In 2 months [place], Devonne Doughty, [facility], [facility]

## 2021-11-24 ENCOUNTER — Encounter: Payer: Self-pay | Admitting: Family Medicine

## 2021-11-24 DIAGNOSIS — M159 Polyosteoarthritis, unspecified: Secondary | ICD-10-CM

## 2021-11-24 DIAGNOSIS — G8929 Other chronic pain: Secondary | ICD-10-CM

## 2021-11-25 MED ORDER — BACLOFEN 10 MG PO TABS: 10.0000 mg | ORAL_TABLET | Freq: Two times a day (BID) | ORAL | 2 refills | Status: DC

## 2021-12-02 ENCOUNTER — Other Ambulatory Visit: Payer: Self-pay | Admitting: Family Medicine

## 2021-12-02 MED ORDER — FAMOTIDINE 40 MG PO TABS
40.0000 mg | ORAL_TABLET | Freq: Two times a day (BID) | ORAL | 3 refills | Status: DC
  Filled 2021-12-02: qty 60, 30d supply, fill #0
  Filled 2021-12-28: qty 60, 30d supply, fill #1
  Filled 2022-01-28: qty 60, 30d supply, fill #2
  Filled 2022-02-25: qty 60, 30d supply, fill #3

## 2021-12-03 ENCOUNTER — Other Ambulatory Visit: Payer: Self-pay

## 2021-12-11 ENCOUNTER — Encounter (INDEPENDENT_AMBULATORY_CARE_PROVIDER_SITE_OTHER): Payer: BC Managed Care – PPO | Admitting: Family Medicine

## 2021-12-11 DIAGNOSIS — K047 Periapical abscess without sinus: Secondary | ICD-10-CM

## 2021-12-11 MED ORDER — AMOXICILLIN-POT CLAVULANATE 875-125 MG PO TABS: 1.0000 | ORAL_TABLET | Freq: Two times a day (BID) | ORAL | 0 refills | Status: DC

## 2021-12-11 NOTE — Telephone Encounter (Signed)

## 2021-12-12 DIAGNOSIS — L501 Idiopathic urticaria: Secondary | ICD-10-CM | POA: Diagnosis not present

## 2021-12-28 ENCOUNTER — Encounter: Payer: Self-pay | Admitting: Family Medicine

## 2021-12-28 ENCOUNTER — Other Ambulatory Visit (HOSPITAL_COMMUNITY): Payer: Self-pay

## 2021-12-30 ENCOUNTER — Other Ambulatory Visit (HOSPITAL_COMMUNITY): Payer: Self-pay

## 2021-12-30 ENCOUNTER — Other Ambulatory Visit: Payer: Self-pay

## 2021-12-30 DIAGNOSIS — M159 Polyosteoarthritis, unspecified: Secondary | ICD-10-CM

## 2021-12-30 DIAGNOSIS — G8929 Other chronic pain: Secondary | ICD-10-CM

## 2021-12-30 MED ORDER — BACLOFEN 10 MG PO TABS: 10.0000 mg | ORAL_TABLET | Freq: Two times a day (BID) | ORAL | 2 refills | Status: DC

## 2022-01-06 ENCOUNTER — Encounter: Payer: Self-pay | Admitting: Family Medicine

## 2022-01-06 ENCOUNTER — Ambulatory Visit: Payer: BC Managed Care – PPO | Admitting: Family Medicine

## 2022-01-06 VITALS — BP 139/71 | HR 98 | Ht 70.0 in | Wt 226.0 lb

## 2022-01-06 DIAGNOSIS — M25551 Pain in right hip: Secondary | ICD-10-CM | POA: Diagnosis not present

## 2022-01-06 DIAGNOSIS — M25552 Pain in left hip: Secondary | ICD-10-CM

## 2022-01-06 DIAGNOSIS — M545 Low back pain, unspecified: Secondary | ICD-10-CM

## 2022-01-06 DIAGNOSIS — M25561 Pain in right knee: Secondary | ICD-10-CM

## 2022-01-06 DIAGNOSIS — G8929 Other chronic pain: Secondary | ICD-10-CM

## 2022-01-06 DIAGNOSIS — M25562 Pain in left knee: Secondary | ICD-10-CM

## 2022-01-06 DIAGNOSIS — M159 Polyosteoarthritis, unspecified: Secondary | ICD-10-CM

## 2022-01-06 MED ORDER — MELOXICAM 15 MG PO TABS: 15.0000 mg | ORAL_TABLET | Freq: Every day | ORAL | 2 refills | Status: DC | PRN

## 2022-01-06 NOTE — Progress Notes (Signed)
Subjective:    Patient ID: Aaron Woodard, male    DOB: [birth year], 51 y.o.   MRN: [id number]  Aaron Woodard is a 51 y.o. male presenting on 01/06/2022 for Hypertension   HPI  Osteoarthritis / joint pain Chronic Low Back Pain Knee Pain HIp Pain  - Last visit with me 09/2021, for initial visit for same problem multiple joint pain, osteoarthritis, treated with Baclofen and Gabapentin, see prior notes for background information. - Interval update with Baclofen helps for muscle spasms and back pain, and he has also tried Gabapentin 19m PM and 2 x in evening before but he admits too much sedation and he is not as comfortable taking it.  - Today patient reports he wishes to continue baclofen, and some gabapentin, but interested in re-try NSAID. He has had issue documented with allergic response swellingn to NSAID before. He is followed by allergist on shots now and wishes to try this, as he took some Aleve recently and did well on it with improved back pain  Reviewed recent X-ray imaging done.  Daily aches and pains - Tylenol 10020mTID eases some pain but doesn't resolve  Admits feels stiff and sore then improve if move   He has chronic back and other joint pain, describes stiffness soreness worse when prolong sit or fixed position then has to get up.  He remains physically active No acute trauma or injury   HTN Home BP 120s/80s       01/06/2022    8:44 AM 09/18/2021    3:39 PM 08/05/2021   10:16 AM  Depression screen PHQ 2/9  Decreased Interest 0 0 0  Down, Depressed, Hopeless 0 0 0  PHQ - 2 Score 0 0 0  Altered sleeping 1 0 0  Tired, decreased energy 1 1 1   Change in appetite 0 0 0  Feeling bad or failure about yourself  0 0 0  Trouble concentrating 0 0 0  Moving slowly or fidgety/restless 0 0 0  Suicidal thoughts 0 0 0  PHQ-9 Score 2 1 1   Difficult doing work/chores Not difficult at all Not difficult at all Not difficult at all    Social History   Tobacco Use    Smoking status: Every Day    Packs/day: 0.50    Years: 15.00    Total pack years: 7.50    Types: Cigarettes   Smokeless tobacco: Never  Vaping Use   Vaping Use: Never used  Substance Use Topics   Alcohol use: Yes    Alcohol/week: 5.0 - 6.0 standard drinks of alcohol    Types: 5 - 6 Standard drinks or equivalent per week    Comment: 2-3 Beers Daily   Drug use: No    Review of Systems Per HPI unless specifically indicated above     Objective:    BP 139/71   Pulse 98   Ht 5' 10"  (1.778 m)   Wt 226 lb (102.5 kg)   SpO2 100%   BMI 32.43 kg/m   Wt Readings from Last 3 Encounters:  01/06/22 226 lb (102.5 kg)  10/01/21 214 lb 12.8 oz (97.4 kg)  09/18/21 220 lb (99.8 kg)    Physical Exam Vitals and nursing note reviewed.  Constitutional:      General: He is not in acute distress.    Appearance: Normal appearance. He is well-developed. He is not diaphoretic.     Comments: Well-appearing, comfortable, cooperative  HENT:     Head: Normocephalic and atraumatic.  Eyes:     General:        Right eye: No discharge.        Left eye: No discharge.     Conjunctiva/sclera: Conjunctivae normal.  Cardiovascular:     Rate and Rhythm: Normal rate.  Pulmonary:     Effort: Pulmonary effort is normal.  Skin:    General: Skin is warm and dry.     Findings: No erythema or rash.  Neurological:     Mental Status: He is alert and oriented to person, place, and time.  Psychiatric:        Mood and Affect: Mood normal.        Behavior: Behavior normal.        Thought Content: Thought content normal.     Comments: Well groomed, good eye contact, normal speech and thoughts    I have personally reviewed the radiology report from 09/24/21 on X-rays.  CLINICAL DATA:  Right-sided lower back pain.   EXAM: LUMBAR SPINE - COMPLETE 4+ VIEW   COMPARISON:  CT abdomen and pelvis 10/04/2016   FINDINGS: There are 5 non-rib-bearing lumbar-type vertebral bodies. Tiny ribs at the T12 level. There  is again a transitional left L5-S1 assimilation joint.   Surgical clips overlie the superior right ilium.   Moderate to high-grade bilateral sacroiliac subchondral sclerosis.   Vertebral body heights are maintained.   Normal sagittal alignment.   Disc spaces are preserved. Mild anterior L1-2 through L4-5 endplate osteophytes. Anterior T12-L1 degenerative bridging osteophytes.   IMPRESSION:: IMPRESSION: 1. Mild anterior endplate osteophytes throughout the lumbar spine. Disc spaces are preserved. 2. There is again symmetric moderate to high-grade bilateral sacroiliac subchondral sclerosis as can be seen with sacroiliitis. This can be seen in the setting of ankylosing spondylitis and inflammatory bowel disease.     Electronically Signed   By: Yvonne Kendall M.D.   On: 09/25/2021 15:31    CLINICAL DATA:  Chronic bilateral knee pain with suspected arthritis.   EXAM: LEFT KNEE - COMPLETE 4+ VIEW   COMPARISON:  None Available.   FINDINGS: Normal bone mineralization. No joint effusion. Minimal chronic enthesopathic change at the quadriceps insertion on the patella. Mild medial and lateral femoral condyle intercondylar notch degenerative spurring. Joint spaces are preserved. No acute fracture or dislocation.   IMPRESSION: Minimal intercondylar notch osteoarthritis.     Electronically Signed   By: Yvonne Kendall M.D.   On: 09/25/2021 15:01  CLINICAL DATA:  Chronic bilateral hip pain with stiffness for 1 month. Clinical concern for arthritis.   EXAM: DG HIP (WITH OR WITHOUT PELVIS) 2-3V RIGHT   COMPARISON:  CT abdomen and pelvis 10/04/2016   FINDINGS: Mild-to-moderate left and mild right femoroacetabular joint space narrowing. Moderate bilateral femoral head-neck junction circumferential degenerative osteophytes. Left-greater-than-right superolateral acetabular degenerative cystic change. The pubic symphysis joint space is maintained. Moderate to  high-grade symmetric sclerosis on the iliac greater than sacral sides of the bilateral sacroiliac joints with mild bilateral joint space narrowing. Surgical clips overlie the superior right ilium. No acute fracture or dislocation.   IMPRESSION:: IMPRESSION: 1. There is again symmetric moderate to high-grade bilateral sacroiliac subchondral sclerosis as can be seen with sacroiliitis. This can be seen in the setting of ankylosing spondylitis and inflammatory bowel disease. 2. Moderate bilateral femoroacetabular osteoarthritis.     Electronically Signed   By: Yvonne Kendall M.D.   On: 09/25/2021 15:27     Results for orders placed or performed in visit on 09/13/21  TSH  Result Value Ref Range   TSH 3.15 0.40 - 4.50 mIU/L  Hepatitis C antibody  Result Value Ref Range   Hepatitis C Ab NON-REACTIVE NON-REACTIVE   SIGNAL TO CUT-OFF 0.23 <1.00  PSA  Result Value Ref Range   PSA 0.22 < OR = 4.00 ng/mL  Hemoglobin A1c  Result Value Ref Range   Hgb A1c MFr Bld 5.8 (H) <5.7 % of total Hgb   Mean Plasma Glucose 120 mg/dL   eAG (mmol/L) 6.6 mmol/L  Lipid panel  Result Value Ref Range   Cholesterol 128 <200 mg/dL   HDL 37 (L) > OR = 40 mg/dL   Triglycerides 100 <150 mg/dL   LDL Cholesterol (Calc) 72 mg/dL (calc)   Total CHOL/HDL Ratio 3.5 <5.0 (calc)   Non-HDL Cholesterol (Calc) 91 <130 mg/dL (calc)  CBC with Differential/Platelet  Result Value Ref Range   WBC 10.7 3.8 - 10.8 Thousand/uL   RBC 5.32 4.20 - 5.80 Million/uL   Hemoglobin 15.0 13.2 - 17.1 g/dL   HCT 43.7 38.5 - 50.0 %   MCV 82.1 80.0 - 100.0 fL   MCH 28.2 27.0 - 33.0 pg   MCHC 34.3 32.0 - 36.0 g/dL   RDW 14.8 11.0 - 15.0 %   Platelets 295 140 - 400 Thousand/uL   MPV 10.6 7.5 - 12.5 fL   Neutro Abs 5,682 1,500 - 7,800 cells/uL   Lymphs Abs 3,585 850 - 3,900 cells/uL   Absolute Monocytes 1,220 (H) 200 - 950 cells/uL   Eosinophils Absolute 182 15 - 500 cells/uL   Basophils Absolute 32 0 - 200 cells/uL    Neutrophils Relative % 53.1 %   Total Lymphocyte 33.5 %   Monocytes Relative 11.4 %   Eosinophils Relative 1.7 %   Basophils Relative 0.3 %  COMPLETE METABOLIC PANEL WITH GFR  Result Value Ref Range   Glucose, Bld 96 65 - 99 mg/dL   BUN 12 7 - 25 mg/dL   Creat 0.81 0.60 - 1.29 mg/dL   eGFR 108 > OR = 60 mL/min/1.63m   BUN/Creatinine Ratio NOT APPLICABLE 6 - 22 (calc)   Sodium 137 135 - 146 mmol/L   Potassium 4.1 3.5 - 5.3 mmol/L   Chloride 101 98 - 110 mmol/L   CO2 27 20 - 32 mmol/L   Calcium 8.7 8.6 - 10.3 mg/dL   Total Protein 7.7 6.1 - 8.1 g/dL   Albumin 3.8 3.6 - 5.1 g/dL   Globulin 3.9 (H) 1.9 - 3.7 g/dL (calc)   AG Ratio 1.0 1.0 - 2.5 (calc)   Total Bilirubin 0.6 0.2 - 1.2 mg/dL   Alkaline phosphatase (APISO) 72 36 - 130 U/L   AST 18 10 - 40 U/L   ALT 18 9 - 46 U/L      Assessment & Plan:   Problem List Items Addressed This Visit   None Visit Diagnoses     Chronic right-sided low back pain without sciatica    -  Primary   Relevant Medications   meloxicam (MOBIC) 15 MG tablet   Primary osteoarthritis involving multiple joints       Relevant Medications   meloxicam (MOBIC) 15 MG tablet   Chronic hip pain, bilateral       Relevant Medications   meloxicam (MOBIC) 15 MG tablet   Chronic pain of both knees       Relevant Medications   meloxicam (MOBIC) 15 MG tablet       OA/DDD multiple joints Back Lumbar spine, Hips,  Knees Likely all back and muscle joint pain from the osteoarthritis symptoms. Chronic symptoms, no acute trauma or injury Reviewed X-rays   Discussed managing pain and symptoms. He is not interested in specialist or surgical consult at this time.   Keep on Tylenol in the future 500 mg x 2 = 1010m 3 times a day as well to keep pain under control in manageable level.   Continue Baclofen 5-149mTID PRN use with caution sedation, trial first before daily use  Continue Gabapentin up to max 30039mer day, caution sedation  NSAID Anti inflammatory  - Start Meloxicam 68m29mily with meal daily for 1-2 weeks then as needed only. You can do 1-2 weeks at a time and then pause it for 1-2 weeks. Taking it long term can cause stomach ulcer side effect or damage kidneys.  It is safe to take Baclofen up to 3 times a day. And continue to take Gabapentin as needed.  If not successful, we can refer to Orthopedic or Spine specialist, physical therapy etc   Meds ordered this encounter  Medications   meloxicam (MOBIC) 15 MG tablet    Sig: Take 1 tablet (15 mg total) by mouth daily as needed for pain.    Dispense:  30 tablet    Refill:  2      Follow up plan: Return in about 3 months (around 04/08/2022) for 3 month follow-up Back Pain / HTN.    AlexNobie Putnam [facility] 01/06/2022, 8:13 AM

## 2022-01-06 NOTE — Patient Instructions (Addendum)
Thank you for coming to the office today.  NSAID Anti inflammatory - Start Meloxicam '15mg'$  daily with meal daily for 1-2 weeks then as needed only. You can do 1-2 weeks at a time and then pause it for 1-2 weeks. Taking it long term can cause stomach ulcer side effect or damage kidneys.  It is safe to take Baclofen up to 3 times a day. And continue to take Gabapentin as needed.  If not successful, we can refer to Orthopedic or Spine specialist, physical therapy etc   Please schedule a Follow-up Appointment to: Return in about 3 months (around 04/08/2022) for 3 month follow-up Back Pain / HTN.  If you have any other questions or concerns, please feel free to call the office or send a message through [place]. You may also schedule an earlier appointment if necessary.  Additionally, you may be receiving a survey about your experience at our office within a few days to 1 week by e-mail or mail. We value your feedback.  Nobie Putnam, DO [place]

## 2022-01-07 ENCOUNTER — Ambulatory Visit: Payer: BC Managed Care – PPO | Admitting: Family Medicine

## 2022-01-09 DIAGNOSIS — L501 Idiopathic urticaria: Secondary | ICD-10-CM | POA: Diagnosis not present

## 2022-01-28 ENCOUNTER — Other Ambulatory Visit (HOSPITAL_COMMUNITY): Payer: Self-pay

## 2022-02-04 ENCOUNTER — Encounter (INDEPENDENT_AMBULATORY_CARE_PROVIDER_SITE_OTHER): Payer: BC Managed Care – PPO | Admitting: Family Medicine

## 2022-02-04 DIAGNOSIS — M159 Polyosteoarthritis, unspecified: Secondary | ICD-10-CM

## 2022-02-04 DIAGNOSIS — I10 Essential (primary) hypertension: Secondary | ICD-10-CM | POA: Diagnosis not present

## 2022-02-04 DIAGNOSIS — G8929 Other chronic pain: Secondary | ICD-10-CM

## 2022-02-06 DIAGNOSIS — L501 Idiopathic urticaria: Secondary | ICD-10-CM | POA: Diagnosis not present

## 2022-02-06 MED ORDER — HYDROCHLOROTHIAZIDE 25 MG PO TABS: 25.0000 mg | ORAL_TABLET | Freq: Every day | ORAL | 2 refills | Status: DC

## 2022-02-06 MED ORDER — MELOXICAM 15 MG PO TABS: 15.0000 mg | ORAL_TABLET | Freq: Every day | ORAL | 2 refills | Status: DC | PRN

## 2022-02-06 NOTE — Telephone Encounter (Signed)

## 2022-02-19 ENCOUNTER — Encounter: Payer: Self-pay | Admitting: Family Medicine

## 2022-02-20 ENCOUNTER — Other Ambulatory Visit: Payer: Self-pay

## 2022-02-20 DIAGNOSIS — M159 Polyosteoarthritis, unspecified: Secondary | ICD-10-CM

## 2022-02-20 DIAGNOSIS — G8929 Other chronic pain: Secondary | ICD-10-CM

## 2022-02-20 MED ORDER — BACLOFEN 10 MG PO TABS: 10.0000 mg | ORAL_TABLET | Freq: Two times a day (BID) | ORAL | 2 refills | Status: DC

## 2022-02-26 ENCOUNTER — Other Ambulatory Visit (HOSPITAL_COMMUNITY): Payer: Self-pay

## 2022-03-02 ENCOUNTER — Encounter: Payer: Self-pay | Admitting: Family Medicine

## 2022-03-04 ENCOUNTER — Other Ambulatory Visit: Payer: Self-pay

## 2022-03-04 DIAGNOSIS — G8929 Other chronic pain: Secondary | ICD-10-CM

## 2022-03-04 DIAGNOSIS — M545 Low back pain, unspecified: Secondary | ICD-10-CM

## 2022-03-04 DIAGNOSIS — M159 Polyosteoarthritis, unspecified: Secondary | ICD-10-CM

## 2022-03-04 DIAGNOSIS — I10 Essential (primary) hypertension: Secondary | ICD-10-CM

## 2022-03-04 MED ORDER — GABAPENTIN 100 MG PO CAPS: ORAL_CAPSULE | ORAL | 2 refills | Status: DC

## 2022-03-04 MED ORDER — HYDROCHLOROTHIAZIDE 25 MG PO TABS: 25.0000 mg | ORAL_TABLET | Freq: Every day | ORAL | 2 refills | Status: DC

## 2022-03-06 DIAGNOSIS — L501 Idiopathic urticaria: Secondary | ICD-10-CM | POA: Diagnosis not present

## 2022-03-11 DIAGNOSIS — H1045 Other chronic allergic conjunctivitis: Secondary | ICD-10-CM | POA: Diagnosis not present

## 2022-03-11 DIAGNOSIS — H5213 Myopia, bilateral: Secondary | ICD-10-CM | POA: Diagnosis not present

## 2022-03-11 DIAGNOSIS — H40013 Open angle with borderline findings, low risk, bilateral: Secondary | ICD-10-CM | POA: Diagnosis not present

## 2022-03-28 DIAGNOSIS — T783XXA Angioneurotic edema, initial encounter: Secondary | ICD-10-CM | POA: Diagnosis not present

## 2022-03-31 ENCOUNTER — Ambulatory Visit: Payer: BC Managed Care – PPO | Admitting: Family Medicine

## 2022-03-31 ENCOUNTER — Encounter: Payer: Self-pay | Admitting: Family Medicine

## 2022-03-31 ENCOUNTER — Other Ambulatory Visit: Payer: Self-pay | Admitting: Family Medicine

## 2022-03-31 VITALS — BP 148/82 | HR 94 | Ht 70.0 in | Wt 236.6 lb

## 2022-03-31 DIAGNOSIS — Z23 Encounter for immunization: Secondary | ICD-10-CM

## 2022-03-31 DIAGNOSIS — M159 Polyosteoarthritis, unspecified: Secondary | ICD-10-CM | POA: Diagnosis not present

## 2022-03-31 DIAGNOSIS — M25551 Pain in right hip: Secondary | ICD-10-CM | POA: Diagnosis not present

## 2022-03-31 DIAGNOSIS — N529 Male erectile dysfunction, unspecified: Secondary | ICD-10-CM

## 2022-03-31 DIAGNOSIS — M25561 Pain in right knee: Secondary | ICD-10-CM | POA: Diagnosis not present

## 2022-03-31 DIAGNOSIS — M25552 Pain in left hip: Secondary | ICD-10-CM

## 2022-03-31 DIAGNOSIS — M545 Low back pain, unspecified: Secondary | ICD-10-CM | POA: Diagnosis not present

## 2022-03-31 DIAGNOSIS — G8929 Other chronic pain: Secondary | ICD-10-CM

## 2022-03-31 DIAGNOSIS — M25562 Pain in left knee: Secondary | ICD-10-CM

## 2022-03-31 MED ORDER — TADALAFIL 20 MG PO TABS: 20.0000 mg | ORAL_TABLET | ORAL | 5 refills | Status: DC | PRN

## 2022-03-31 NOTE — Progress Notes (Signed)
Subjective:    Patient ID: Aaron Woodard, male    DOB: [birth year], 51 y.o.   MRN: [id number]  Aaron Woodard is a 51 y.o. male presenting on 03/31/2022 for Back Pain   HPI  Osteoarthritis / joint pain Chronic Low Back Pain Knee Pain Hip Pain  Follow up from last visit 3 months 12/2021, several med orders to help his back pain Taking Gabapentin 125m in AM and 1060min PM, also taking Baclofen 1065mn AM and in PM at same time. He feels sleepy groggy during the day. - Also taking Meloxicam 59m6m 1 week then off 1 week, then episodic - He says back pain overall is manageable   Reviewed recent X-ray imaging done.     Admits feels stiff and sore then improve if move   He has chronic back and other joint pain, describes stiffness soreness worse when prolong sit or fixed position then has to get up.  He remains physically active No acute trauma or injury  Overall improved   CHRONIC HTN: Previously controlled on Amlodipine 5-10mg56m then swelling, came off med and swapped to HCTZ thiazide with improved edema but still higher BP Home BP readings 130-140 range, has not checked lately Current Meds - HCTZ 25mg 54my No side effect or issue Denies CP, dyspnea, HA, edema, dizziness / lightheadedness  Erectile Dysfunction Worsening problem over past several months or more. Some improve on Sildenafil 3-5 pills per dose, but seems not always adequate Admits poor or reduced libido   HM Flu Shot today  Upcoming Colonoscopy 04/28/22 scheduled     03/31/2022    3:03 PM 01/06/2022    8:44 AM 09/18/2021    3:39 PM  Depression screen PHQ 2/9  Decreased Interest 0 0 0  Down, Depressed, Hopeless 0 0 0  PHQ - 2 Score 0 0 0  Altered sleeping 0 1 0  Tired, decreased energy _0 Change in appetite 0 0 0  Feeling bad or failure about yourself  0 0 0  Trouble concentrating 0 0 0  Moving slowly or fidgety/restless 0 0 0  Suicidal thoughts 0 0 0  PHQ-9 Score _1 Difficult doing  work/chores Not difficult at all Not difficult at all Not difficult at all      03/31/2022    3:03 PM 01/06/2022    8:45 AM 09/18/2021    3:40 PM 08/05/2021   10:16 AM  GAD 7 : Generalized Anxiety Score  Nervous, Anxious, on Edge 0 0 0 0  Control/stop worrying 0 0 0 0  Worry too much - different things 0 0 0 1  Trouble relaxing 0 0 0 0  Restless 0 0 0 0  Easily annoyed or irritable 0 0 0 0  Afraid - awful might happen 0 0 0 0  Total GAD 7 Score 0 0 0 1  Anxiety Difficulty Not difficult at all Not difficult at all Not difficult at all Not difficult at all     Social History   Tobacco Use   Smoking status: Every Day    Packs/day: 0.50    Years: 15.00    Total pack years: 7.50    Types: Cigarettes   Smokeless tobacco: Never  Vaping Use   Vaping Use: Never used  Substance Use Topics   Alcohol use: Yes    Alcohol/week: 5.0 - 6.0 standard drinks of alcohol    Types: 5 - 6 Standard drinks or equivalent per week  Comment: 2-3 Beers Daily   Drug use: No    Review of Systems Per HPI unless specifically indicated above     Objective:    BP (!) 148/82 (BP Location: Left Arm, Cuff Size: Normal)   Pulse 94   Ht _0  (1.778 m)   Wt 236 lb 9.6 oz (107.3 kg)   SpO2 100%   BMI 33.95 kg/m   Wt Readings from Last 3 Encounters:  03/31/22 236 lb 9.6 oz (107.3 kg)  01/06/22 226 lb (102.5 kg)  10/01/21 214 lb 12.8 oz (97.4 kg)    Physical Exam Vitals and nursing note reviewed.  Constitutional:      General: He is not in acute distress.    Appearance: He is well-developed. He is not diaphoretic.     Comments: Well-appearing, comfortable, cooperative  HENT:     Head: Normocephalic and atraumatic.  Eyes:     General:        Right eye: No discharge.        Left eye: No discharge.     Conjunctiva/sclera: Conjunctivae normal.  Neck:     Thyroid: No thyromegaly.  Cardiovascular:     Rate and Rhythm: Normal rate and regular rhythm.     Pulses: Normal pulses.     Heart  sounds: Normal heart sounds. No murmur heard. Pulmonary:     Effort: Pulmonary effort is normal. No respiratory distress.     Breath sounds: Normal breath sounds. No wheezing or rales.  Musculoskeletal:        General: Normal range of motion.     Cervical back: Normal range of motion and neck supple.     Right lower leg: No edema.     Left lower leg: No edema.  Lymphadenopathy:     Cervical: No cervical adenopathy.  Skin:    General: Skin is warm and dry.     Findings: No erythema or rash.  Neurological:     Mental Status: He is alert and oriented to person, place, and time. Mental status is at baseline.  Psychiatric:        Behavior: Behavior normal.     Comments: Well groomed, good eye contact, normal speech and thoughts      Results for orders placed or performed in visit on 09/13/21  TSH  Result Value Ref Range   TSH 3.15 0.40 - 4.50 mIU/L  Hepatitis C antibody  Result Value Ref Range   Hepatitis C Ab NON-REACTIVE NON-REACTIVE   SIGNAL TO CUT-OFF 0.23 <1.00  PSA  Result Value Ref Range   PSA 0.22 < OR = 4.00 ng/mL  Hemoglobin A1c  Result Value Ref Range   Hgb A1c MFr Bld 5.8 (H) <5.7 % of total Hgb   Mean Plasma Glucose 120 mg/dL   eAG (mmol/L) 6.6 mmol/L  Lipid panel  Result Value Ref Range   Cholesterol 128 <200 mg/dL   HDL 37 (L) > OR = 40 mg/dL   Triglycerides 100 <150 mg/dL   LDL Cholesterol (Calc) 72 mg/dL (calc)   Total CHOL/HDL Ratio 3.5 <5.0 (calc)   Non-HDL Cholesterol (Calc) 91 <130 mg/dL (calc)  CBC with Differential/Platelet  Result Value Ref Range   WBC 10.7 3.8 - 10.8 Thousand/uL   RBC 5.32 4.20 - 5.80 Million/uL   Hemoglobin 15.0 13.2 - 17.1 g/dL   HCT 43.7 38.5 - 50.0 %   MCV 82.1 80.0 - 100.0 fL   MCH 28.2 27.0 - 33.0 pg   MCHC 34.3 32.0 -  36.0 g/dL   RDW 14.8 11.0 - 15.0 %   Platelets 295 140 - 400 Thousand/uL   MPV 10.6 7.5 - 12.5 fL   Neutro Abs 5,682 1,500 - 7,800 cells/uL   Lymphs Abs 3,585 850 - 3,900 cells/uL   Absolute  Monocytes 1,220 (H) 200 - 950 cells/uL   Eosinophils Absolute 182 15 - 500 cells/uL   Basophils Absolute 32 0 - 200 cells/uL   Neutrophils Relative % 53.1 %   Total Lymphocyte 33.5 %   Monocytes Relative 11.4 %   Eosinophils Relative 1.7 %   Basophils Relative 0.3 %  COMPLETE METABOLIC PANEL WITH GFR  Result Value Ref Range   Glucose, Bld 96 65 - 99 mg/dL   BUN 12 7 - 25 mg/dL   Creat 0.81 0.60 - 1.29 mg/dL   eGFR 108 > OR = 60 mL/min/1.55m   BUN/Creatinine Ratio NOT APPLICABLE 6 - 22 (calc)   Sodium 137 135 - 146 mmol/L   Potassium 4.1 3.5 - 5.3 mmol/L   Chloride 101 98 - 110 mmol/L   CO2 27 20 - 32 mmol/L   Calcium 8.7 8.6 - 10.3 mg/dL   Total Protein 7.7 6.1 - 8.1 g/dL   Albumin 3.8 3.6 - 5.1 g/dL   Globulin 3.9 (H) 1.9 - 3.7 g/dL (calc)   AG Ratio 1.0 1.0 - 2.5 (calc)   Total Bilirubin 0.6 0.2 - 1.2 mg/dL   Alkaline phosphatase (APISO) 72 36 - 130 U/L   AST 18 10 - 40 U/L   ALT 18 9 - 46 U/L      Assessment & Plan:   Problem List Items Addressed This Visit   None Visit Diagnoses     Chronic right-sided low back pain without sciatica    -  Primary   Relevant Medications   gabapentin (NEURONTIN) 100 MG capsule   Primary osteoarthritis involving multiple joints       Relevant Medications   gabapentin (NEURONTIN) 100 MG capsule   Chronic pain of both knees       Relevant Medications   gabapentin (NEURONTIN) 100 MG capsule   Chronic hip pain, bilateral       Relevant Medications   gabapentin (NEURONTIN) 100 MG capsule   Erectile dysfunction, unspecified erectile dysfunction type       Relevant Medications   tadalafil (CIALIS) 20 MG tablet   Needs flu shot       Relevant Orders   Flu Vaccine QUAD 621moM (Fluarix, Fluzone & Alfiuria Quad PF)        For the back pain  Baclofen x 2 in the morning. Skip evening dose. May take extra in PM if need.  Gabapentin skip morning dose. 10065m 2 in the evening. May go up to x 3 = 300m70mKeep on the meloxicam as you  are. Intermittent dosing.  Discontinue Sildenafil (viagra)  Switch to new rx Tadalafil (Cialis), 20mg63mke one pill per dose. last for 36 hours, and can retake every 48 hours.  BP still mildly elevated 140s-150s. Goal to be < 140 / 90  If you can check BP regularly, that will help us un[place] the trend.  Next time if ED are improved, but BP still high, we can change and increase the fluid pill or add a 2nd pill.  If erections not fixed, we can check Testosterone and refer to Urologist.  Orders Placed This Encounter  Procedures   Flu Vaccine QUAD 71mo+I14moluarix, Fluzone & Alfiuria Quad PF)  Meds ordered this encounter  Medications   tadalafil (CIALIS) 20 MG tablet    Sig: Take 1 tablet (20 mg total) by mouth every other day as needed for erectile dysfunction.    Dispense:  30 tablet    Refill:  5      Follow up plan: Return in about 3 months (around 07/01/2022) for 3 month follow-up HTN, Back, ED.   Nobie Putnam, [facility] 03/31/2022, 3:16 PM

## 2022-03-31 NOTE — Telephone Encounter (Signed)
Requested Prescriptions  Pending Prescriptions Disp Refills   meloxicam (MOBIC) 15 MG tablet [Pharmacy Med Name: MELOXICAM 15 MG TABLET] 90 tablet 0    Sig: TAKE 1 TABLET BY MOUTH EVERY DAY AS NEEDED FOR PAIN     Analgesics:  COX2 Inhibitors Failed - 03/31/2022  2:01 AM      Failed - Manual Review: Labs are only required if the patient has taken medication for more than 8 weeks.      Passed - HGB in normal range and within 360 days    Hemoglobin  Date Value Ref Range Status  09/13/2021 15.0 13.2 - 17.1 g/dL Final         Passed - Cr in normal range and within 360 days    Creat  Date Value Ref Range Status  09/13/2021 0.81 0.60 - 1.29 mg/dL Final         Passed - HCT in normal range and within 360 days    HCT  Date Value Ref Range Status  09/13/2021 43.7 38.5 - 50.0 % Final         Passed - AST in normal range and within 360 days    AST  Date Value Ref Range Status  09/13/2021 18 10 - 40 U/L Final         Passed - ALT in normal range and within 360 days    ALT  Date Value Ref Range Status  09/13/2021 18 9 - 46 U/L Final         Passed - eGFR is 30 or above and within 360 days    GFR calc Af Amer  Date Value Ref Range Status  12/21/2019 >60 >60 mL/min Final   GFR, Estimated  Date Value Ref Range Status  08/17/2020 >60 >60 mL/min Final    Comment:    (NOTE) Calculated using the CKD-EPI Creatinine Equation (2021)    eGFR  Date Value Ref Range Status  09/13/2021 108 > OR = 60 mL/min/1.53m Final    Comment:    The eGFR is based on the CKD-EPI 2021 equation. To calculate  the new eGFR from a previous Creatinine or Cystatin C result, go to https://www.kidney.org/professionals/ kdoqi/gfr%5Fcalculator          Passed - Patient is not pregnant      Passed - Valid encounter within last 12 months    Recent Outpatient Visits           Today Chronic right-sided low back pain without sciatica   SKinde DO   2 months  ago Chronic right-sided low back pain without sciatica   SBabbie DO   6 months ago Primary osteoarthritis involving multiple joints   [place] DO   6 months ago Annual physical exam   [facility]   7 months ago Essential hypertension   SFox DO       Future Appointments             In 3 months KParks Ranger [facility]

## 2022-03-31 NOTE — Patient Instructions (Addendum)
Thank you for coming to the office today.  For the back pain  Baclofen x 2 in the morning. Skip evening dose. May take extra in PM if need.  Gabapentin skip morning dose. '100mg'$  x 2 in the evening. May go up to x 3 = '300mg'$ .  Keep on the meloxicam as you are. Intermittent dosing.  Discontinue Sildenafil (viagra)  Switch to new rx Tadalafil (Cialis), '20mg'$ , take one pill per dose. last for 36 hours, and can retake every 48 hours.  BP still mildly elevated 140s-150s. Goal to be < 140 / 90  If you can check BP regularly, that will help [place] understand the trend.  Next time if ED are improved, but BP still high, we can change and increase the fluid pill or add a 2nd pill.  If erections not fixed, we can check Testosterone and refer to Urologist.   Please schedule a Follow-up Appointment to: Return in about 3 months (around 07/01/2022) for 3 month follow-up HTN, Back, ED.  If you have any other questions or concerns, please feel free to call the office or send a message through [place]. You may also schedule an earlier appointment if necessary.  Additionally, you may be receiving a survey about your experience at our office within a few days to 1 week by e-mail or mail. We value your feedback.  Nobie Putnam, DO [place]

## 2022-04-09 ENCOUNTER — Other Ambulatory Visit: Payer: Self-pay

## 2022-04-09 ENCOUNTER — Encounter: Payer: Self-pay | Admitting: Family Medicine

## 2022-04-09 DIAGNOSIS — I10 Essential (primary) hypertension: Secondary | ICD-10-CM

## 2022-04-09 MED ORDER — HYDROCHLOROTHIAZIDE 25 MG PO TABS: 25.0000 mg | ORAL_TABLET | Freq: Every day | ORAL | 3 refills | Status: DC

## 2022-04-20 ENCOUNTER — Encounter: Payer: Self-pay | Admitting: Family Medicine

## 2022-04-21 ENCOUNTER — Other Ambulatory Visit: Payer: Self-pay

## 2022-04-21 DIAGNOSIS — G8929 Other chronic pain: Secondary | ICD-10-CM

## 2022-04-21 DIAGNOSIS — M159 Polyosteoarthritis, unspecified: Secondary | ICD-10-CM

## 2022-04-21 MED ORDER — BACLOFEN 10 MG PO TABS: 10.0000 mg | ORAL_TABLET | Freq: Two times a day (BID) | ORAL | 2 refills | Status: DC

## 2022-04-23 ENCOUNTER — Telehealth: Payer: Self-pay

## 2022-04-23 DIAGNOSIS — Z1211 Encounter for screening for malignant neoplasm of colon: Secondary | ICD-10-CM

## 2022-04-23 MED ORDER — NA SULFATE-K SULFATE-MG SULF 17.5-3.13-1.6 GM/177ML PO SOLN: 1.0000 | Freq: Once | ORAL | 0 refills | Status: AC

## 2022-04-23 NOTE — Telephone Encounter (Signed)
Patient has requested to reschedule his colonoscopy to late January.  Colonoscopy has been rescheduled from 04/28/22 to 06/18/22.  Trish in Endo notified of date change.  Instructions updated, referral updated, rx sent to [facility] in [place].  Thanks,  [place], [place]

## 2022-04-25 ENCOUNTER — Other Ambulatory Visit: Payer: Self-pay | Admitting: Family Medicine

## 2022-04-25 DIAGNOSIS — I10 Essential (primary) hypertension: Secondary | ICD-10-CM

## 2022-04-25 NOTE — Telephone Encounter (Signed)
Requested medication (s) are due for refill today - no  Requested medication (s) are on the active medication list -no  Future visit scheduled -yes  Last refill: medication no longer on current list  Notes to clinic: medication requested no longer on current medication list  Requested Prescriptions  Pending Prescriptions Disp Refills   amLODipine (NORVASC) 10 MG tablet [Pharmacy Med Name: AMLODIPINE BESYLATE 10 MG TAB] 90 tablet 1    Sig: TAKE 1 TABLET BY MOUTH EVERY DAY     Cardiovascular: Calcium Channel Blockers 2 Failed - 04/25/2022  1:50 AM      Failed - Last BP in normal range    BP Readings from Last 1 Encounters:  03/31/22 (!) 148/82         Passed - Last Heart Rate in normal range    Pulse Readings from Last 1 Encounters:  03/31/22 94         Passed - Valid encounter within last 6 months    Recent Outpatient Visits           3 weeks ago Chronic right-sided low back pain without sciatica   [place], DO   3 months ago Chronic right-sided low back pain without sciatica   [place], DO   6 months ago Primary osteoarthritis involving multiple joints   [place], DO   7 months ago Annual physical exam   [facility] Olin Hauser, DO   8 months ago Essential hypertension   [place], DO       Future Appointments             In 2 months Parks Ranger, Devonne Doughty, [facility], [facility]               Requested Prescriptions  Pending Prescriptions Disp Refills   amLODipine ([place]) 10 MG tablet [Pharmacy Med Name: AMLODIPINE BESYLATE 10 MG TAB] 90 tablet 1    Sig: TAKE 1 TABLET BY MOUTH EVERY DAY     Cardiovascular: Calcium Channel Blockers 2 Failed - 04/25/2022  1:50 AM      Failed - Last BP in normal range    BP Readings from Last 1 Encounters:   03/31/22 (!) 148/82         Passed - Last Heart Rate in normal range    Pulse Readings from Last 1 Encounters:  03/31/22 94         Passed - Valid encounter within last 6 months    Recent Outpatient Visits           3 weeks ago Chronic right-sided low back pain without sciatica   [place], DO   3 months ago Chronic right-sided low back pain without sciatica   [place], DO   6 months ago Primary osteoarthritis involving multiple joints   [facility] [place], Devonne Doughty, DO   7 months ago Annual physical exam   [facility] Olin Hauser, DO   8 months ago Essential hypertension   [place], DO       Future Appointments             In 2 months Parks Ranger, Devonne Doughty, [facility], [facility]

## 2022-05-08 ENCOUNTER — Encounter: Payer: Self-pay | Admitting: Family Medicine

## 2022-05-08 ENCOUNTER — Telehealth (INDEPENDENT_AMBULATORY_CARE_PROVIDER_SITE_OTHER): Payer: BC Managed Care – PPO | Admitting: Family Medicine

## 2022-05-08 VITALS — BP 140/84 | Ht 70.0 in | Wt 225.0 lb

## 2022-05-08 DIAGNOSIS — N529 Male erectile dysfunction, unspecified: Secondary | ICD-10-CM | POA: Diagnosis not present

## 2022-05-08 DIAGNOSIS — E291 Testicular hypofunction: Secondary | ICD-10-CM

## 2022-05-08 DIAGNOSIS — I10 Essential (primary) hypertension: Secondary | ICD-10-CM

## 2022-05-08 DIAGNOSIS — E538 Deficiency of other specified B group vitamins: Secondary | ICD-10-CM

## 2022-05-08 DIAGNOSIS — R7309 Other abnormal glucose: Secondary | ICD-10-CM

## 2022-05-08 DIAGNOSIS — R6882 Decreased libido: Secondary | ICD-10-CM | POA: Diagnosis not present

## 2022-05-08 MED ORDER — HYDROCHLOROTHIAZIDE 25 MG PO TABS: 25.0000 mg | ORAL_TABLET | Freq: Every day | ORAL | 3 refills | Status: DC

## 2022-05-08 NOTE — Telephone Encounter (Signed)
Pt had an appt with Dr. Raliegh Ip today and it was refilled.

## 2022-05-08 NOTE — Assessment & Plan Note (Signed)
Mildly elevated initial BP based on readings - Home BP readings reviewed  No known complications Failed Amlodipine due to edema    Plan:  1. Continue current BP regimen HCTZ '25mg'$  daily - Consider Chlorthalidone in future for inc potency 2. Encourage improved lifestyle - low sodium diet, regular exercise 3. Continue monitor BP outside office, bring readings to next visit, if persistently >140/90 or new symptoms notify office sooner

## 2022-05-08 NOTE — Patient Instructions (Addendum)
   Please schedule a Follow-up Appointment to: Return if symptoms worsen or fail to improve.  If you have any other questions or concerns, please feel free to call the office or send a message through MyChart. You may also schedule an earlier appointment if necessary.  Additionally, you may be receiving a survey about your experience at our office within a few days to 1 week by e-mail or mail. We value your feedback.  Dezarae Mcclaran, DO [facility], [facility] 

## 2022-05-08 NOTE — Progress Notes (Signed)
Subjective:    Patient ID: Aaron Woodard, male    DOB: Oct 27, 1970, 51 y.o.   MRN: [id number]  Aaron Woodard is a 51 y.o. male presenting on 05/08/2022 for Hyperlipidemia and Erectile Dysfunction  Virtual / Telehealth Encounter - Video Visit via MyChart The purpose of this virtual visit is to provide medical care while limiting exposure to the novel coronavirus (COVID19) for both patient and office staff.  Consent was obtained for remote visit:  Yes.   Answered questions that patient had about telehealth interaction:  Yes.   I discussed the limitations, risks, security and privacy concerns of performing an evaluation and management service by video/telephone. I also discussed with the patient that there may be a patient responsible charge related to this service. The patient expressed understanding and agreed to proceed.  Patient Location: Home Provider Location: [facility] (Office)  Participants in virtual visit: - Patient: Aaron Woodard - CMA: Orinda Kenner, CMA - Provider: Dr Parks Ranger   HPI  [place]   CHRONIC HTN: Home BP readings 140-150 / 80s, he is asymptomatic Needs refill HCTZ Home BP readings 130-140 range, has not checked lately Current Meds - HCTZ 18m daily No side effect or issue Denies CP, dyspnea, HA, edema, dizziness / lightheadedness   Erectile Dysfunction Worsening problem over past several months or more. Some improve on Sildenafil 3-5 pills per dose, but seems not always adequate Admits poor or reduced libido failed Tadalafil as well Due for lab for testosterone as discussed      03/31/2022    3:03 PM 01/06/2022    8:44 AM 09/18/2021    3:39 PM  Depression screen PHQ 2/9  Decreased Interest 0 0 0  Down, Depressed, Hopeless 0 0 0  PHQ - 2 Score 0 0 0  Altered sleeping 0 1 0  Tired, decreased energy _0 Change in appetite 0 0 0  Feeling bad or failure about yourself  0 0 0  Trouble concentrating 0 0 0   Moving slowly or fidgety/restless 0 0 0  Suicidal thoughts 0 0 0  PHQ-9 Score _1 Difficult doing work/chores Not difficult at all Not difficult at all Not difficult at all    Social History   Tobacco Use   Smoking status: Every Day    Packs/day: 0.50    Years: 15.00    Total pack years: 7.50    Types: Cigarettes   Smokeless tobacco: Never  Vaping Use   Vaping Use: Never used  Substance Use Topics   Alcohol use: Yes    Alcohol/week: 5.0 - 6.0 standard drinks of alcohol    Types: 5 - 6 Standard drinks or equivalent per week    Comment: 2-3 Beers Daily   Drug use: No    Review of Systems Per HPI unless specifically indicated above     Objective:    BP (!) 140/84   Ht _2  (1.778 m)   Wt 225 lb (102.1 kg)   BMI 32.28 kg/m   Wt Readings from Last 3 Encounters:  05/08/22 225 lb (102.1 kg)  03/31/22 236 lb 9.6 oz (107.3 kg)  01/06/22 226 lb (102.5 kg)    Physical Exam  Note examination was completely remotely via video observation objective data only  Gen - well-appearing, no acute distress or apparent pain, comfortable HEENT - eyes appear clear without discharge or redness Heart/Lungs - cannot examine virtually - observed no evidence of coughing  or labored breathing. Abd - cannot examine virtually  Skin - face visible today- no rash Neuro - awake, alert, oriented Psych - not anxious appearing  Results for orders placed or performed in visit on 09/13/21  TSH  Result Value Ref Range   TSH 3.15 0.40 - 4.50 mIU/L  Hepatitis C antibody  Result Value Ref Range   Hepatitis C Ab NON-REACTIVE NON-REACTIVE   SIGNAL TO CUT-OFF 0.23 <1.00  PSA  Result Value Ref Range   PSA 0.22 < OR = 4.00 ng/mL  Hemoglobin A1c  Result Value Ref Range   Hgb A1c MFr Bld 5.8 (H) <5.7 % of total Hgb   Mean Plasma Glucose 120 mg/dL   eAG (mmol/L) 6.6 mmol/L  Lipid panel  Result Value Ref Range   Cholesterol 128 <200 mg/dL   HDL 37 (L) > OR = 40 mg/dL   Triglycerides 100 <150  mg/dL   LDL Cholesterol (Calc) 72 mg/dL (calc)   Total CHOL/HDL Ratio 3.5 <5.0 (calc)   Non-HDL Cholesterol (Calc) 91 <130 mg/dL (calc)  CBC with Differential/Platelet  Result Value Ref Range   WBC 10.7 3.8 - 10.8 Thousand/uL   RBC 5.32 4.20 - 5.80 Million/uL   Hemoglobin 15.0 13.2 - 17.1 g/dL   HCT 43.7 38.5 - 50.0 %   MCV 82.1 80.0 - 100.0 fL   MCH 28.2 27.0 - 33.0 pg   MCHC 34.3 32.0 - 36.0 g/dL   RDW 14.8 11.0 - 15.0 %   Platelets 295 140 - 400 Thousand/uL   MPV 10.6 7.5 - 12.5 fL   Neutro Abs 5,682 1,500 - 7,800 cells/uL   Lymphs Abs 3,585 850 - 3,900 cells/uL   Absolute Monocytes 1,220 (H) 200 - 950 cells/uL   Eosinophils Absolute 182 15 - 500 cells/uL   Basophils Absolute 32 0 - 200 cells/uL   Neutrophils Relative % 53.1 %   Total Lymphocyte 33.5 %   Monocytes Relative 11.4 %   Eosinophils Relative 1.7 %   Basophils Relative 0.3 %  COMPLETE METABOLIC PANEL WITH GFR  Result Value Ref Range   Glucose, Bld 96 65 - 99 mg/dL   BUN 12 7 - 25 mg/dL   Creat 0.81 0.60 - 1.29 mg/dL   eGFR 108 > OR = 60 mL/min/1.71m   BUN/Creatinine Ratio NOT APPLICABLE 6 - 22 (calc)   Sodium 137 135 - 146 mmol/L   Potassium 4.1 3.5 - 5.3 mmol/L   Chloride 101 98 - 110 mmol/L   CO2 27 20 - 32 mmol/L   Calcium 8.7 8.6 - 10.3 mg/dL   Total Protein 7.7 6.1 - 8.1 g/dL   Albumin 3.8 3.6 - 5.1 g/dL   Globulin 3.9 (H) 1.9 - 3.7 g/dL (calc)   AG Ratio 1.0 1.0 - 2.5 (calc)   Total Bilirubin 0.6 0.2 - 1.2 mg/dL   Alkaline phosphatase (APISO) 72 36 - 130 U/L   AST 18 10 - 40 U/L   ALT 18 9 - 46 U/L      Assessment & Plan:   Problem List Items Addressed This Visit     Essential hypertension - Primary    Mildly elevated initial BP based on readings - Home BP readings reviewed  No known complications Failed Amlodipine due to edema    Plan:  1. Continue current BP regimen HCTZ 218mdaily - Consider Chlorthalidone in future for inc potency 2. Encourage improved lifestyle - low sodium diet,  regular exercise 3. Continue monitor BP outside office, bring  readings to next visit, if persistently >140/90 or new symptoms notify office sooner      Relevant Medications   hydrochlorothiazide (HYDRODIURIL) 25 MG tablet   Other Visit Diagnoses     Low libido       Relevant Orders   Testosterone   Vitamin B12   Erectile dysfunction, unspecified erectile dysfunction type       Vitamin B12 nutritional deficiency       Relevant Orders   Vitamin B12   Hypogonadism male       Relevant Orders   Testosterone   Elevated hemoglobin A1c       Relevant Orders   Hemoglobin A1c       Low Libido / Reduced Energy ED Can be multifactorial including BP Failed Sildenafil and Tadalafil Check labs upcoming, Testosterone in AM, A1c and Vitamin B12 for lab work up Next consider referral to urologist following labs if low T or if normal but still uncertain etiology  Orders Placed This Encounter  Procedures   Testosterone    Standing Status:   Future    Standing Expiration Date:   07/18/2022   Vitamin B12    Standing Status:   Future    Standing Expiration Date:   07/18/2022   Hemoglobin A1c    Standing Status:   Future    Standing Expiration Date:   07/18/2022     Meds ordered this encounter  Medications   hydrochlorothiazide (HYDRODIURIL) 25 MG tablet    Sig: Take 1 tablet (25 mg total) by mouth daily.    Dispense:  90 tablet    Refill:  3      Follow up plan: Return if symptoms worsen or fail to improve.  Follow-up: - Return next week - Future labs 05/13/22 830am  Patient verbalizes understanding with the above medical recommendations including the limitation of remote medical advice.  Specific follow-up and call-back criteria were given for patient to follow-up or seek medical care more urgently if needed.  Total duration of direct patient care provided via video conference: 10  minutes   Nobie Putnam, [facility] 05/08/2022, 4:12 PM

## 2022-05-09 ENCOUNTER — Other Ambulatory Visit: Payer: Self-pay

## 2022-05-09 DIAGNOSIS — E291 Testicular hypofunction: Secondary | ICD-10-CM

## 2022-05-09 DIAGNOSIS — R7309 Other abnormal glucose: Secondary | ICD-10-CM

## 2022-05-09 DIAGNOSIS — R6882 Decreased libido: Secondary | ICD-10-CM

## 2022-05-09 DIAGNOSIS — E538 Deficiency of other specified B group vitamins: Secondary | ICD-10-CM

## 2022-05-13 ENCOUNTER — Other Ambulatory Visit: Payer: BC Managed Care – PPO

## 2022-05-13 DIAGNOSIS — R6882 Decreased libido: Secondary | ICD-10-CM | POA: Diagnosis not present

## 2022-05-13 DIAGNOSIS — E291 Testicular hypofunction: Secondary | ICD-10-CM | POA: Diagnosis not present

## 2022-05-13 DIAGNOSIS — R7309 Other abnormal glucose: Secondary | ICD-10-CM | POA: Diagnosis not present

## 2022-05-13 DIAGNOSIS — E538 Deficiency of other specified B group vitamins: Secondary | ICD-10-CM | POA: Diagnosis not present

## 2022-05-14 LAB — TESTOSTERONE: Testosterone: 326 ng/dL (ref 250–827)

## 2022-05-14 LAB — HEMOGLOBIN A1C
Hgb A1c MFr Bld: 6.3 % of total Hgb — ABNORMAL HIGH (ref <5.7)
Mean Plasma Glucose: 134 mg/dL
eAG (mmol/L): 7.4 mmol/L

## 2022-05-14 LAB — VITAMIN B12: Vitamin B-12: 378 pg/mL (ref 200–1100)

## 2022-05-16 ENCOUNTER — Encounter: Payer: Self-pay | Admitting: Family Medicine

## 2022-05-16 DIAGNOSIS — R7303 Prediabetes: Secondary | ICD-10-CM | POA: Insufficient documentation

## 2022-05-18 ENCOUNTER — Encounter: Payer: Self-pay | Admitting: Family Medicine

## 2022-05-18 DIAGNOSIS — E291 Testicular hypofunction: Secondary | ICD-10-CM

## 2022-05-18 DIAGNOSIS — R7309 Other abnormal glucose: Secondary | ICD-10-CM

## 2022-06-09 ENCOUNTER — Encounter: Payer: Self-pay | Admitting: Family Medicine

## 2022-06-09 DIAGNOSIS — G8929 Other chronic pain: Secondary | ICD-10-CM

## 2022-06-09 DIAGNOSIS — M159 Polyosteoarthritis, unspecified: Secondary | ICD-10-CM

## 2022-06-09 MED ORDER — BACLOFEN 10 MG PO TABS: 10.0000 mg | ORAL_TABLET | Freq: Two times a day (BID) | ORAL | 2 refills | Status: DC

## 2022-06-18 ENCOUNTER — Ambulatory Visit: Payer: BC Managed Care – PPO | Admitting: Anesthesiology

## 2022-06-18 ENCOUNTER — Ambulatory Visit
Admission: RE | Admit: 2022-06-18 | Discharge: 2022-06-18 | Disposition: A | Discharge status: Home / Self Care | Payer: BC Managed Care – PPO | Source: Ambulatory Visit | Attending: Gastroenterology | Admitting: Gastroenterology

## 2022-06-18 ENCOUNTER — Encounter: Payer: Self-pay | Admitting: Gastroenterology

## 2022-06-18 ENCOUNTER — Encounter
Admission: RE | Disposition: A | Discharge status: Home / Self Care | Payer: Self-pay | Source: Ambulatory Visit | Attending: Gastroenterology

## 2022-06-18 DIAGNOSIS — K635 Polyp of colon: Secondary | ICD-10-CM | POA: Diagnosis not present

## 2022-06-18 DIAGNOSIS — Z79899 Other long term (current) drug therapy: Secondary | ICD-10-CM | POA: Insufficient documentation

## 2022-06-18 DIAGNOSIS — K529 Noninfective gastroenteritis and colitis, unspecified: Secondary | ICD-10-CM | POA: Diagnosis not present

## 2022-06-18 DIAGNOSIS — D126 Benign neoplasm of colon, unspecified: Secondary | ICD-10-CM

## 2022-06-18 DIAGNOSIS — E669 Obesity, unspecified: Secondary | ICD-10-CM | POA: Diagnosis not present

## 2022-06-18 DIAGNOSIS — Z1211 Encounter for screening for malignant neoplasm of colon: Secondary | ICD-10-CM | POA: Diagnosis not present

## 2022-06-18 DIAGNOSIS — I10 Essential (primary) hypertension: Secondary | ICD-10-CM | POA: Diagnosis not present

## 2022-06-18 DIAGNOSIS — Z6832 Body mass index (BMI) 32.0-32.9, adult: Secondary | ICD-10-CM | POA: Insufficient documentation

## 2022-06-18 DIAGNOSIS — F17210 Nicotine dependence, cigarettes, uncomplicated: Secondary | ICD-10-CM | POA: Diagnosis not present

## 2022-06-18 DIAGNOSIS — K633 Ulcer of intestine: Secondary | ICD-10-CM | POA: Diagnosis not present

## 2022-06-18 HISTORY — PX: COLONOSCOPY WITH PROPOFOL: SHX5780

## 2022-06-18 SURGERY — COLONOSCOPY WITH PROPOFOL
Anesthesia: General

## 2022-06-18 MED ORDER — SODIUM CHLORIDE 0.9 % IV SOLN: INTRAVENOUS | Status: DC | PRN

## 2022-06-18 MED ORDER — ONDANSETRON HCL 4 MG/2ML IJ SOLN
INTRAMUSCULAR | Status: DC | PRN
  Administered 2022-06-18: 4 mg via INTRAVENOUS

## 2022-06-18 MED ORDER — SODIUM CHLORIDE 0.9 % IV SOLN
INTRAVENOUS | Status: DC
  Administered 2022-06-18: 1000 mL via INTRAVENOUS

## 2022-06-18 MED ORDER — PROPOFOL 500 MG/50ML IV EMUL
INTRAVENOUS | Status: DC | PRN
  Administered 2022-06-18 (×3): 50 mg via INTRAVENOUS
  Administered 2022-06-18: 150 ug/kg/min via INTRAVENOUS
  Administered 2022-06-18: 50 mg via INTRAVENOUS

## 2022-06-18 MED ORDER — STERILE WATER FOR IRRIGATION IR SOLN
Status: DC | PRN
  Administered 2022-06-18: 100 mL

## 2022-06-18 NOTE — Transfer of Care (Signed)
Immediate Anesthesia Transfer of Care Note  Patient: Aaron Woodard  Procedure(s) Performed: COLONOSCOPY WITH PROPOFOL  Patient Location: PACU  Anesthesia Type:MAC  Level of Consciousness: drowsy  Airway & Oxygen Therapy: Patient Spontanous Breathing and Patient connected to face mask oxygen  Post-op Assessment: Report given to RN and Post -op Vital signs reviewed and stable  Post vital signs: Reviewed  Last Vitals:  Vitals Value Taken Time  BP 122/82 06/18/22 0815  Temp 35.9 C 06/18/22 0815  Pulse 88 06/18/22 0815  Resp 24 06/18/22 0815  SpO2 100 % 06/18/22 0815    Last Pain:  Vitals:   06/18/22 0815  TempSrc: Temporal  PainSc: Asleep         Complications: No notable events documented.

## 2022-06-18 NOTE — Anesthesia Preprocedure Evaluation (Addendum)
Anesthesia Evaluation  Patient identified by MRN, date of birth, ID band Patient awake    Reviewed: Allergy & Precautions, H&P , NPO status , Patient's Chart, lab work & pertinent test results  Airway Mallampati: III  TM Distance: >3 FB Neck ROM: full    Dental  (+) Loose,    Pulmonary Current Smoker and Patient abstained from smoking.   Pulmonary exam normal        Cardiovascular Exercise Tolerance: Good hypertension, Pt. on medications Normal cardiovascular exam     Neuro/Psych negative neurological ROS  negative psych ROS   GI/Hepatic negative GI ROS, Neg liver ROS,,,  Endo/Other  negative endocrine ROS    Renal/GU negative Renal ROS  negative genitourinary   Musculoskeletal   Abdominal  (+) + obese  Peds  Hematology negative hematology ROS (+)   Anesthesia Other Findings Past Medical History: No date: Acute appendicitis with localized peritonitis No date: Allergy 10/04/2016: Appendicitis, acute  Past Surgical History: 10/04/2016: LAPAROSCOPIC APPENDECTOMY; N/A     Comment:  Procedure: APPENDECTOMY LAPAROSCOPIC;  Surgeon: Florene Glen, MD;  Location: [facility] ORS;  Service: General;                Laterality: N/A;     Reproductive/Obstetrics negative OB ROS                             Anesthesia Physical Anesthesia Plan  ASA: 2  Anesthesia Plan: General   Post-op Pain Management:    Induction: Intravenous  PONV Risk Score and Plan: Propofol infusion and TIVA  Airway Management Planned: Natural Airway  Additional Equipment:   Intra-op Plan:   Post-operative Plan:   Informed Consent: I have reviewed the patients History and Physical, chart, labs and discussed the procedure including the risks, benefits and alternatives for the proposed anesthesia with the patient or authorized representative who has indicated his/her understanding and acceptance.      Dental Advisory Given  Plan Discussed with: CRNA and Surgeon  Anesthesia Plan Comments:         Anesthesia Quick Evaluation

## 2022-06-18 NOTE — H&P (Signed)
Aaron Bellows, MD [address] [place], [place], [place], [address] [id number], [place], [place], [place], [address] Phone: [phone]  Fax: [phone]  Primary Care Physician:  Olin Hauser, DO   Pre-Procedure History & Physical: HPI:  Aaron Woodard is a 52 y.o. male is here for an colonoscopy.   Past Medical History:  Diagnosis Date   Acute appendicitis with localized peritonitis    Allergy    Appendicitis, acute 10/04/2016    Past Surgical History:  Procedure Laterality Date   APPENDECTOMY     LAPAROSCOPIC APPENDECTOMY N/A 10/04/2016   Procedure: APPENDECTOMY LAPAROSCOPIC;  Surgeon: Florene Glen, MD;  Location: [facility] ORS;  Service: General;  Laterality: N/A;    Prior to Admission medications   Medication Sig Start Date End Date Taking? Authorizing Provider  baclofen (LIORESAL) 10 MG tablet Take 1 tablet (10 mg total) by mouth 2 (two) times daily. 06/09/22  Yes Karamalegos, Devonne Doughty, DO  cetirizine (ZYRTEC) 10 MG tablet Take 10 mg by mouth at bedtime. 07/03/21 07/03/22 Yes [provider]  chlorhexidine (PERIDEX) 0.12 % solution Use as directed 15 mLs in the mouth or throat 2 (two) times daily. 10/29/21  Yes Karamalegos, Devonne Doughty, DO  famotidine (PEPCID) 40 MG tablet Take 1 tablet (40 mg total) by mouth 2 (two) times daily. 12/02/21  Yes Karamalegos, Devonne Doughty, DO  gabapentin (NEURONTIN) 100 MG capsule Take 2-3 capsules (200-300 mg total) by mouth at bedtime. 03/31/22  Yes Karamalegos, Devonne Doughty, DO  hydrochlorothiazide (HYDRODIURIL) 25 MG tablet Take 1 tablet (25 mg total) by mouth daily. 05/08/22  Yes Karamalegos, Devonne Doughty, DO  meloxicam (MOBIC) 15 MG tablet TAKE 1 TABLET BY MOUTH EVERY DAY AS NEEDED FOR PAIN 03/31/22  Yes Karamalegos, Devonne Doughty, DO  omalizumab (XOLAIR) 150 MG injection Inject 150 mg into the skin every 28 (twenty-eight) days.   Yes [provider]  EPINEPHrine 0.3 mg/0.3 mL IJ SOAJ injection Inject  0.3 mg into the muscle as needed for anaphylaxis. 10/29/21   Karamalegos, Devonne Doughty, DO  tadalafil (CIALIS) 20 MG tablet Take 1 tablet (20 mg total) by mouth every other day as needed for erectile dysfunction. 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   Nsaids Swelling 09/18/2021   Pork-derived products Swelling 08/17/2020    Family History  Problem Relation Age of Onset   Diabetes Mother    Hypertension Mother    Heart disease Mother    Heart failure Mother    Healthy Father    Healthy Sister    Diabetes Maternal Aunt    Diabetes Cousin     Social History   Socioeconomic History   Marital status: Married    Spouse name: Not on file   Number of children: Not on file   Years of education: Not on file   Highest education level: Not on file  Occupational History   Not on file  Tobacco Use   Smoking status: Every Day    Packs/day: 0.50    Years: 15.00    Total pack years: 7.50    Types: Cigarettes   Smokeless tobacco: Never  Vaping Use   Vaping Use: Never used  Substance and Sexual Activity   Alcohol use: Yes    Alcohol/week: 5.0 - 6.0 standard drinks of alcohol    Types: 5 - 6 Standard drinks or  equivalent per week    Comment: 2-3 Beers Daily   Drug use: No   Sexual activity: Yes  Other Topics Concern   Not on file  Social History Narrative   Not on file   Social Determinants of Health   Financial Resource Strain: Not on file  Food Insecurity: Not on file  Transportation Needs: Not on file  Physical Activity: Not on file  Stress: Not on file  Social Connections: Not on file  Intimate Partner Violence: Not on file    Review of Systems: See HPI, otherwise negative ROS  Physical Exam: BP (!) 137/90   Pulse 94   Temp (!) 96.7 F (35.9 C) (Temporal)   Resp 20   Ht '5\' 10"'$  (1.778 m)   Wt 103.6 kg   SpO2 96%   BMI 32.78 kg/m   General:   Alert,  pleasant and cooperative in NAD Head:  Normocephalic and atraumatic. Neck:  Supple; no masses or thyromegaly. Lungs:  Clear throughout to auscultation, normal respiratory effort.    Heart:  +S1, +S2, Regular rate and rhythm, No edema. Abdomen:  Soft, nontender and nondistended. Normal bowel sounds, without guarding, and without rebound.   Neurologic:  Alert and  oriented x4;  grossly normal neurologically.  Impression/Plan: [name] is here for an colonoscopy to be performed for Screening colonoscopy average risk   Risks, benefits, limitations, and alternatives regarding  colonoscopy have been reviewed with the patient.  Questions have been answered.  All parties agreeable.   Aaron Bellows, MD  06/18/2022, 7:48 AM

## 2022-06-18 NOTE — Anesthesia Postprocedure Evaluation (Signed)
Anesthesia Post Note  Patient: Aaron Woodard  Procedure(s) Performed: COLONOSCOPY WITH PROPOFOL  Patient location during evaluation: PACU Anesthesia Type: General Level of consciousness: awake and alert Pain management: pain level controlled Vital Signs Assessment: post-procedure vital signs reviewed and stable Respiratory status: spontaneous breathing, nonlabored ventilation and respiratory function stable Cardiovascular status: blood pressure returned to baseline and stable Postop Assessment: no apparent nausea or vomiting Anesthetic complications: no   No notable events documented.   Last Vitals:  Vitals:   06/18/22 0709 06/18/22 0815  BP: (!) 137/90 122/82  Pulse: 94 88  Resp: 20 (!) 24  Temp: (!) 35.9 C (!) 35.9 C  SpO2: 96% 100%    Last Pain:  Vitals:   06/18/22 0815  TempSrc: Temporal  PainSc: [place]

## 2022-06-18 NOTE — Op Note (Signed)
[facility] Gastroenterology Patient Name: Aaron Woodard Procedure Date: 06/18/2022 7:35 AM MRN: [id number] Account #: [id number] Date of Birth: July 28, 1970 Admit Type: Outpatient Age: 52 Room: [facility] ENDO ROOM 3 Gender: Male Note Status: Finalized Instrument Name: Jasper Riling [id number] Procedure:             Colonoscopy Indications:           Screening for colorectal malignant neoplasm Providers:             Jonathon Bellows MD, MD Referring MD:          Olin Hauser (Referring MD) Medicines:             Monitored Anesthesia Care Complications:         No immediate complications. Procedure:             Pre-Anesthesia Assessment:                        - Prior to the procedure, a History and Physical was                         performed, and patient medications, allergies and                         sensitivities were reviewed. The patient's tolerance                         of previous anesthesia was reviewed.                        - The risks and benefits of the procedure and the                         sedation options and risks were discussed with the                         patient. All questions were answered and informed                         consent was obtained.                        - ASA Grade Assessment: II - A patient with mild                         systemic disease.                        After obtaining informed consent, the colonoscope was                         passed under direct vision. Throughout the procedure,                         the patient's blood pressure, pulse, and oxygen                         saturations were monitored continuously. The                         Colonoscope was introduced  through the anus and                         advanced to the the cecum, identified by the                         appendiceal orifice. The colonoscopy was performed                         with ease. The patient tolerated the procedure  well.                         The quality of the bowel preparation was excellent.                         The ileocecal valve, appendiceal orifice, and rectum                         were photographed. Findings:      The perianal and digital rectal examinations were normal.      Two sessile polyps were found in the ascending colon. The polyps were 3       to 4 mm in size. These polyps were removed with a jumbo cold forceps.       Resection and retrieval were complete.      Discontinuous areas of ulcerated mucosa with no stigmata of recent       bleeding were present in the entire colon. Biopsies were taken with a       cold forceps for histology. Multiple scattered apthous ulcers      The exam was otherwise without abnormality on direct and retroflexion       views. Impression:            - Two 3 to 4 mm polyps in the ascending colon, removed                         with a jumbo cold forceps. Resected and retrieved.                        - Mucosal ulceration in the entire examined colon.                         Biopsied.                        - The examination was otherwise normal on direct and                         retroflexion views. Recommendation:        - Discharge patient to home (with escort).                        - Resume previous diet.                        - Continue present medications.                        - Await pathology results.                        -  Repeat colonoscopy for surveillance based on                         pathology results. Procedure Code(s):     --- Professional ---                        [phone], Colonoscopy, flexible; with biopsy, single or                         multiple Diagnosis Code(s):     --- Professional ---                        Z12.11, Encounter for screening for malignant neoplasm                         of colon                        D12.2, Benign neoplasm of ascending colon                        K63.3, Ulcer of intestine CPT  copyright 2022 American Medical Association. All rights reserved. The codes documented in this report are preliminary and upon coder review may  be revised to meet current compliance requirements. Jonathon Bellows, MD Jonathon Bellows MD, MD 06/18/2022 8:12:24 AM This report has been signed electronically. Number of Addenda: 0 Note Initiated On: 06/18/2022 7:35 AM Scope Withdrawal Time: 0 hours 9 minutes 45 seconds  Total Procedure Duration: 0 hours 13 minutes 11 seconds  Estimated Blood Loss:  Estimated blood loss: none.      [facility]

## 2022-06-19 ENCOUNTER — Encounter: Payer: Self-pay | Admitting: Gastroenterology

## 2022-06-19 LAB — SURGICAL PATHOLOGY

## 2022-06-26 ENCOUNTER — Other Ambulatory Visit: Payer: Self-pay | Admitting: Family Medicine

## 2022-06-26 DIAGNOSIS — M159 Polyosteoarthritis, unspecified: Secondary | ICD-10-CM

## 2022-06-26 DIAGNOSIS — G8929 Other chronic pain: Secondary | ICD-10-CM

## 2022-06-26 NOTE — Telephone Encounter (Signed)
Requested Prescriptions  Pending Prescriptions Disp Refills   meloxicam (MOBIC) 15 MG tablet [Pharmacy Med Name: MELOXICAM 15 MG TABLET] 90 tablet 1    Sig: TAKE 1 TABLET BY MOUTH EVERY DAY AS NEEDED FOR PAIN     Analgesics:  COX2 Inhibitors Failed - 06/26/2022  1:29 AM      Failed - Manual Review: Labs are only required if the patient has taken medication for more than 8 weeks.      Passed - HGB in normal range and within 360 days    Hemoglobin  Date Value Ref Range Status  09/13/2021 15.0 13.2 - 17.1 g/dL Final         Passed - Cr in normal range and within 360 days    Creat  Date Value Ref Range Status  09/13/2021 0.81 0.60 - 1.29 mg/dL Final         Passed - HCT in normal range and within 360 days    HCT  Date Value Ref Range Status  09/13/2021 43.7 38.5 - 50.0 % Final         Passed - AST in normal range and within 360 days    AST  Date Value Ref Range Status  09/13/2021 18 10 - 40 U/L Final         Passed - ALT in normal range and within 360 days    ALT  Date Value Ref Range Status  09/13/2021 18 9 - 46 U/L Final         Passed - eGFR is 30 or above and within 360 days    GFR calc Af Amer  Date Value Ref Range Status  12/21/2019 >60 >60 mL/min Final   GFR, Estimated  Date Value Ref Range Status  08/17/2020 >60 >60 mL/min Final    Comment:    (NOTE) Calculated using the CKD-EPI Creatinine Equation (2021)    eGFR  Date Value Ref Range Status  09/13/2021 108 > OR = 60 mL/min/1.34m Final    Comment:    The eGFR is based on the CKD-EPI 2021 equation. To calculate  the new eGFR from a previous Creatinine or Cystatin C result, go to https://www.kidney.org/professionals/ kdoqi/gfr%5Fcalculator          Passed - Patient is not pregnant      Passed - Valid encounter within last 12 months    Recent Outpatient Visits           1 month ago Essential hypertension   [place] DO   2 months ago Chronic  right-sided low back pain without sciatica   [place]   5 months ago Chronic right-sided low back pain without sciatica   [place]   8 months ago [facility] osteoarthritis involving multiple joints   [facility]   9 months ago [facility]       Future Appointments             In 1 month KParks Ranger [facility] [place]

## 2022-07-01 ENCOUNTER — Ambulatory Visit: Payer: BC Managed Care – PPO | Admitting: Family Medicine

## 2022-08-07 ENCOUNTER — Other Ambulatory Visit: Payer: Self-pay | Admitting: Family Medicine

## 2022-08-07 ENCOUNTER — Encounter: Payer: Self-pay | Admitting: Family Medicine

## 2022-08-07 ENCOUNTER — Other Ambulatory Visit: Payer: Self-pay

## 2022-08-07 DIAGNOSIS — I10 Essential (primary) hypertension: Secondary | ICD-10-CM

## 2022-08-07 MED ORDER — CETIRIZINE HCL 10 MG PO TABS
10.0000 mg | ORAL_TABLET | Freq: Every day | ORAL | 11 refills | Status: DC
  Filled 2022-08-07: qty 30, 30d supply, fill #0

## 2022-08-07 MED ORDER — HYDROCHLOROTHIAZIDE 25 MG PO TABS: 25.0000 mg | ORAL_TABLET | Freq: Every day | ORAL | 3 refills | Status: DC

## 2022-08-07 MED FILL — Famotidine Tab 40 MG: ORAL | 30 days supply | Qty: 60 | Fill #0 | Status: AC

## 2022-08-07 NOTE — Telephone Encounter (Signed)
Requested Prescriptions  Pending Prescriptions Disp Refills   famotidine (PEPCID) 40 MG tablet 60 tablet 0    Sig: Take 1 tablet (40 mg total) by mouth 2 (two) times daily.     Gastroenterology:  H2 Antagonists Passed - 08/07/2022 10:20 AM      Passed - Valid encounter within last 12 months    Recent Outpatient Visits           3 months ago Essential hypertension   [place], DO   4 months ago Chronic right-sided low back pain without sciatica   [place], DO   7 months ago Chronic right-sided low back pain without sciatica   [place], DO   10 months ago Primary osteoarthritis involving multiple joints   [facility] Olin Hauser, DO   10 months ago Annual physical exam   [facility] Olin Hauser, DO       Future Appointments             In 1 week Parks Ranger, Devonne Doughty, [facility], [facility]

## 2022-08-08 ENCOUNTER — Other Ambulatory Visit: Payer: Self-pay

## 2022-08-08 DIAGNOSIS — E291 Testicular hypofunction: Secondary | ICD-10-CM

## 2022-08-08 DIAGNOSIS — R7309 Other abnormal glucose: Secondary | ICD-10-CM

## 2022-08-10 ENCOUNTER — Other Ambulatory Visit: Payer: Self-pay

## 2022-08-11 ENCOUNTER — Other Ambulatory Visit: Payer: BC Managed Care – PPO

## 2022-08-11 DIAGNOSIS — E291 Testicular hypofunction: Secondary | ICD-10-CM | POA: Diagnosis not present

## 2022-08-11 DIAGNOSIS — R7309 Other abnormal glucose: Secondary | ICD-10-CM | POA: Diagnosis not present

## 2022-08-12 LAB — TESTOSTERONE: Testosterone: 374 ng/dL (ref 250–827)

## 2022-08-12 LAB — HEMOGLOBIN A1C
Hgb A1c MFr Bld: 6.3 % of total Hgb — ABNORMAL HIGH (ref <5.7)
Mean Plasma Glucose: 134 mg/dL
eAG (mmol/L): 7.4 mmol/L

## 2022-08-14 ENCOUNTER — Other Ambulatory Visit: Payer: Self-pay | Admitting: Family Medicine

## 2022-08-14 DIAGNOSIS — G8929 Other chronic pain: Secondary | ICD-10-CM

## 2022-08-14 DIAGNOSIS — M545 Low back pain, unspecified: Secondary | ICD-10-CM

## 2022-08-14 DIAGNOSIS — M159 Polyosteoarthritis, unspecified: Secondary | ICD-10-CM

## 2022-08-14 NOTE — Telephone Encounter (Signed)
Requested Prescriptions  Pending Prescriptions Disp Refills   baclofen (LIORESAL) 10 MG tablet [Pharmacy Med Name: BACLOFEN 10 MG TABLET] 180 tablet 0    Sig: TAKE 1 TABLET BY MOUTH TWICE A DAY     Analgesics:  Muscle Relaxants - baclofen Failed - 08/14/2022 12:57 PM      Failed - Cr in normal range and within 180 days    Creat  Date Value Ref Range Status  09/13/2021 0.81 0.60 - 1.29 mg/dL Final         Failed - eGFR is 30 or above and within 180 days    GFR calc Af Amer  Date Value Ref Range Status  12/21/2019 >60 >60 mL/min Final   GFR, Estimated  Date Value Ref Range Status  08/17/2020 >60 >60 mL/min Final    Comment:    (NOTE) Calculated using the CKD-EPI Creatinine Equation (2021)    eGFR  Date Value Ref Range Status  09/13/2021 108 > OR = 60 mL/min/1.53m2 Final    Comment:    The eGFR is based on the CKD-EPI 2021 equation. To calculate  the new eGFR from a previous Creatinine or Cystatin C result, go to https://www.kidney.org/professionals/ kdoqi/gfr%5Fcalculator          Passed - Valid encounter within last 6 months    Recent Outpatient Visits           3 months ago Essential hypertension   [place], DO   4 months ago Chronic right-sided low back pain without sciatica   [place], DO   7 months ago Chronic right-sided low back pain without sciatica   [place], DO   10 months ago Primary osteoarthritis involving multiple joints   [facility] Olin Hauser, DO   11 months ago Annual physical exam   [facility] Parks Ranger, Devonne Doughty, DO       Future Appointments             In 5 days Parks Ranger, Devonne Doughty, [facility], [place]

## 2022-08-19 ENCOUNTER — Encounter: Payer: Self-pay | Admitting: Family Medicine

## 2022-08-19 ENCOUNTER — Ambulatory Visit: Payer: BC Managed Care – PPO | Admitting: Family Medicine

## 2022-08-19 VITALS — BP 126/72 | HR 90 | Temp 96.9°F | Wt 234.0 lb

## 2022-08-19 DIAGNOSIS — M159 Polyosteoarthritis, unspecified: Secondary | ICD-10-CM

## 2022-08-19 DIAGNOSIS — M25551 Pain in right hip: Secondary | ICD-10-CM

## 2022-08-19 DIAGNOSIS — R7303 Prediabetes: Secondary | ICD-10-CM

## 2022-08-19 DIAGNOSIS — N529 Male erectile dysfunction, unspecified: Secondary | ICD-10-CM

## 2022-08-19 DIAGNOSIS — M25552 Pain in left hip: Secondary | ICD-10-CM

## 2022-08-19 DIAGNOSIS — M25562 Pain in left knee: Secondary | ICD-10-CM

## 2022-08-19 DIAGNOSIS — I10 Essential (primary) hypertension: Secondary | ICD-10-CM | POA: Diagnosis not present

## 2022-08-19 DIAGNOSIS — G8929 Other chronic pain: Secondary | ICD-10-CM

## 2022-08-19 DIAGNOSIS — M545 Low back pain, unspecified: Secondary | ICD-10-CM | POA: Diagnosis not present

## 2022-08-19 DIAGNOSIS — M25561 Pain in right knee: Secondary | ICD-10-CM

## 2022-08-19 DIAGNOSIS — L0201 Cutaneous abscess of face: Secondary | ICD-10-CM

## 2022-08-19 DIAGNOSIS — M150 Primary generalized (osteo)arthritis: Secondary | ICD-10-CM

## 2022-08-19 MED ORDER — AMOXICILLIN-POT CLAVULANATE 875-125 MG PO TABS: 1.0000 | ORAL_TABLET | Freq: Two times a day (BID) | ORAL | 0 refills | Status: DC

## 2022-08-19 MED ORDER — BACLOFEN 10 MG PO TABS: 10.0000 mg | ORAL_TABLET | Freq: Three times a day (TID) | ORAL | 3 refills | Status: DC

## 2022-08-19 MED ORDER — GABAPENTIN 300 MG PO CAPS: 300.0000 mg | ORAL_CAPSULE | Freq: Every day | ORAL | 3 refills | Status: DC

## 2022-08-19 MED ORDER — TADALAFIL 20 MG PO TABS: 20.0000 mg | ORAL_TABLET | ORAL | 5 refills | Status: DC | PRN

## 2022-08-19 NOTE — Progress Notes (Signed)
Inform that the polyps resected were benign.  Biopsies of the colon showed mild inflammation if he is symptomatic in terms of any rectal bleeding or diarrhea he should come and see me if he has no colon issues then repeat colonoscopy in 10 years and follow-up with me as needed if there is any issues of his colon

## 2022-08-19 NOTE — Patient Instructions (Addendum)
Thank you for coming to the office today.  Send GI a mychart message to confirm - but it should be 10 years on the repeat Colonoscopy.  Keep on the same BP medication, seems to be working.  Limit alcohol intake as best you can.  Take Cialis every other day for now, or try to predict earlier in day if you can.  New rx Gabapentin 300mg  single dose in evening, ordered 90 days +  New order Baclofen for 3 pills per day for 270 per bottle + refills  Start antibiotic for the abscess of the jaw area.  Augmentin antibiotic 10 days  If abscess not improving, please go back to your Dentist to have them re-evaluate it, they may take X-rays or CT  If we need we can do imaging and refer to ENT next.   DUE for FASTING BLOOD WORK (no food or drink after midnight before the lab appointment, only water or coffee without cream/sugar on the morning of)  SCHEDULE "Lab Only" visit in the morning at the clinic for lab draw in 3 MONTHS   - Make sure Lab Only appointment is at about 1 week before your next appointment, so that results will be available  For Lab Results, once available within 2-3 days of blood draw, you can can log in to MyChart online to view your results and a brief explanation. Also, we can discuss results at next follow-up visit.   Please schedule a Follow-up Appointment to: Return in about 3 months (around 11/18/2022) for 3 month fasting lab only then 1 week later Annual Physical.  If you have any other questions or concerns, please feel free to call the office or send a message through [place]. You may also schedule an earlier appointment if necessary.  Additionally, you may be receiving a survey about your experience at our office within a few days to 1 week by e-mail or mail. We value your feedback.  Nobie Putnam, DO [place]

## 2022-08-19 NOTE — Progress Notes (Unsigned)
Subjective:    Patient ID: Aaron Woodard, male    DOB: 1970-06-25, 52 y.o.   MRN: [id number]  Aaron Woodard is a 52 y.o. male presenting on 08/19/2022 for Hypertension   HPI  CHRONIC HTN: Home BP readings 130-140 / 70-80s, high 140/90 rarely. Since prior visit he has come off of Amlodipine due to leg swelling Current Meds - HCTZ 25mg  daily - continues on this therapy monotherapy now. No side effect or issue Denies CP, dyspnea, HA, edema, dizziness / lightheadedness  Elevated A1c / Pre Diabetes Elevated results previously 6.3, last lab 07/2022 also 6.3, unchanged, prior range 5.8 Meds: none Lifestyle: - Diet (goal to limit carb starches, also drinking more alcohol lately)  Denies hypoglycemia, polyuria, visual changes, numbness or tingling.  Bereavement / Grief, prolonged He reports still struggling some with loss of his mother 1 year ago He does drink more alcohol, not every day but can be drinking more over past year.  Erectile Dysfunction Failed Sildenafil Improved on Tadalafil 20mg  but taking it too soon before sex. Works better the next day  Osteoarthritis multiple joints Doing well with medication management Taking Gabapentin 100-300mg  at night, does feel sleepy Taking Baclofen 10mg  2 to 3 times   Dental Infection R lower Chin Abscess Blood and pus will fill up Due to infection in tooth 10 months ago       08/19/2022    8:26 AM 03/31/2022    3:03 PM 01/06/2022    8:44 AM  Depression screen PHQ 2/9  Decreased Interest 0 0 0  Down, Depressed, Hopeless 0 0 0  PHQ - 2 Score 0 0 0  Altered sleeping 1 0 1  Tired, decreased energy 1 1 1   Change in appetite 0 0 0  Feeling bad or failure about yourself  0 0 0  Trouble concentrating 0 0 0  Moving slowly or fidgety/restless 0 0 0  Suicidal thoughts 0 0 0  PHQ-9 Score 2 1 2   Difficult doing work/chores Not difficult at all Not difficult at all Not difficult at all    Social History   Tobacco Use   Smoking  status: Every Day    Packs/day: 0.50    Years: 15.00    Additional pack years: 0.00    Total pack years: 7.50    Types: Cigarettes   Smokeless tobacco: Never  Vaping Use   Vaping Use: Never used  Substance Use Topics   Alcohol use: Yes    Alcohol/week: 5.0 - 6.0 standard drinks of alcohol    Types: 5 - 6 Standard drinks or equivalent per week    Comment: 2-3 Beers Daily   Drug use: No    Review of Systems Per HPI unless specifically indicated above     Objective:    BP 126/72 (BP Location: Left Arm, Patient Position: Sitting, Cuff Size: Large)   Pulse 90   Temp (!) 96.9 F (36.1 C) (Temporal)   Wt 234 lb (106.1 kg)   SpO2 99%   BMI 33.58 kg/m   Wt Readings from Last 3 Encounters:  08/19/22 234 lb (106.1 kg)  06/18/22 228 lb 7.4 oz (103.6 kg)  05/08/22 225 lb (102.1 kg)    Physical Exam Vitals and nursing note reviewed.  Constitutional:      General: He is not in acute distress.    Appearance: Normal appearance. He is well-developed. He is not diaphoretic.     Comments: Well-appearing, comfortable, cooperative  HENT:     Head:  Normocephalic and atraumatic.  Eyes:     General:        Right eye: No discharge.        Left eye: No discharge.     Conjunctiva/sclera: Conjunctivae normal.  Cardiovascular:     Rate and Rhythm: Normal rate.  Pulmonary:     Effort: Pulmonary effort is normal.  Skin:    General: Skin is warm and dry.     Findings: Lesion (R submental region with indurated area, slight warm, no erythema, recent drainage) present. No erythema or rash.  Neurological:     Mental Status: He is alert and oriented to person, place, and time.  Psychiatric:        Mood and Affect: Mood normal.        Behavior: Behavior normal.        Thought Content: Thought content normal.     Comments: Well groomed, good eye contact, normal speech and thoughts    Results for orders placed or performed in visit on 08/08/22  Testosterone  Result Value Ref Range    Testosterone 374 250 - 827 ng/dL  Hemoglobin A1c  Result Value Ref Range   Hgb A1c MFr Bld 6.3 (H) <5.7 % of total Hgb   Mean Plasma Glucose 134 mg/dL   eAG (mmol/L) 7.4 mmol/L      Assessment & Plan:   Problem List Items Addressed This Visit     Essential hypertension - Primary   Relevant Medications   tadalafil (CIALIS) 20 MG tablet   Pre-diabetes   Other Visit Diagnoses     Primary osteoarthritis involving multiple joints       Relevant Medications   gabapentin (NEURONTIN) 300 MG capsule   baclofen (LIORESAL) 10 MG tablet   Chronic right-sided low back pain without sciatica       Relevant Medications   gabapentin (NEURONTIN) 300 MG capsule   baclofen (LIORESAL) 10 MG tablet   Chronic pain of both knees       Relevant Medications   gabapentin (NEURONTIN) 300 MG capsule   baclofen (LIORESAL) 10 MG tablet   Chronic hip pain, bilateral       Relevant Medications   gabapentin (NEURONTIN) 300 MG capsule   baclofen (LIORESAL) 10 MG tablet   Erectile dysfunction, unspecified erectile dysfunction type       Relevant Medications   tadalafil (CIALIS) 20 MG tablet   Submental abscess       Relevant Medications   amoxicillin-clavulanate (AUGMENTIN) 875-125 MG tablet       Send GI a mychart message to confirm - but it should be 10 years on the repeat Colonoscopy.  Keep on the same BP medication, seems to be working.  A1c up to 6.3, at risk of future diabetes progression Improve diet lifestyle limit carb starch sugar Limit alcohol intake as best you can.  Take Cialis every other day for now, or try to predict earlier in day if you can.  New rx Gabapentin 300mg  single dose in evening, ordered 90 days +  New order Baclofen for 3 pills per day for 270 per bottle + refills  Start antibiotic for the abscess of the jaw area.  Augmentin antibiotic 10 days  If abscess not improving, please go back to your Dentist to have them re-evaluate it, they may take X-rays or CT  If  we need we can do imaging and refer to ENT next.   Meds ordered this encounter  Medications   gabapentin (NEURONTIN) 300  MG capsule    Sig: Take 1 capsule (300 mg total) by mouth at bedtime.    Dispense:  90 capsule    Refill:  3   baclofen (LIORESAL) 10 MG tablet    Sig: Take 1 tablet (10 mg total) by mouth 3 (three) times daily.    Dispense:  270 tablet    Refill:  3   tadalafil (CIALIS) 20 MG tablet    Sig: Take 1 tablet (20 mg total) by mouth every other day as needed for erectile dysfunction.    Dispense:  90 tablet    Refill:  5   amoxicillin-clavulanate (AUGMENTIN) 875-125 MG tablet    Sig: Take 1 tablet by mouth 2 (two) times daily.    Dispense:  20 tablet    Refill:  0     Follow up plan: Return in about 3 months (around 11/18/2022) for 3 month fasting lab only then 1 week later Annual Physical.  Future labs ordered for 11/17/22   Nobie Putnam, [facility] 08/19/2022, 8:26 AM

## 2022-08-20 ENCOUNTER — Other Ambulatory Visit: Payer: Self-pay | Admitting: Family Medicine

## 2022-08-20 DIAGNOSIS — Z125 Encounter for screening for malignant neoplasm of prostate: Secondary | ICD-10-CM

## 2022-08-20 DIAGNOSIS — E786 Lipoprotein deficiency: Secondary | ICD-10-CM

## 2022-08-20 DIAGNOSIS — I10 Essential (primary) hypertension: Secondary | ICD-10-CM

## 2022-08-20 DIAGNOSIS — Z0000 Encounter for general adult medical examination without abnormal findings: Secondary | ICD-10-CM

## 2022-08-20 DIAGNOSIS — R7303 Prediabetes: Secondary | ICD-10-CM

## 2022-08-28 ENCOUNTER — Telehealth: Payer: Self-pay

## 2022-08-28 NOTE — Telephone Encounter (Signed)
Called patient but had to leave him a message to call me back.

## 2022-08-28 NOTE — Telephone Encounter (Signed)
-----   Message from Wyline Mood, MD sent at 08/19/2022  1:09 PM EDT ----- Inform that the polyps resected were benign.  Biopsies of the colon showed mild inflammation if he is symptomatic in terms of any rectal bleeding or diarrhea he should come and see me if he has no colon issues then repeat colonoscopy in 10 years and  follow-up with me as needed if there is any issues of his colon

## 2022-08-29 IMAGING — DX DG HIP (WITH OR WITHOUT PELVIS) 2-3V*R*
3 series · 3 of 3 positions shown · non-contrast
Comparison: CT abdomen and pelvis 10/04/2016

CLINICAL DATA: Chronic bilateral hip pain with stiffness for 1
month. Clinical concern for arthritis.

EXAM:
DG HIP (WITH OR WITHOUT PELVIS) 2-3V RIGHT

[pelvis ap]
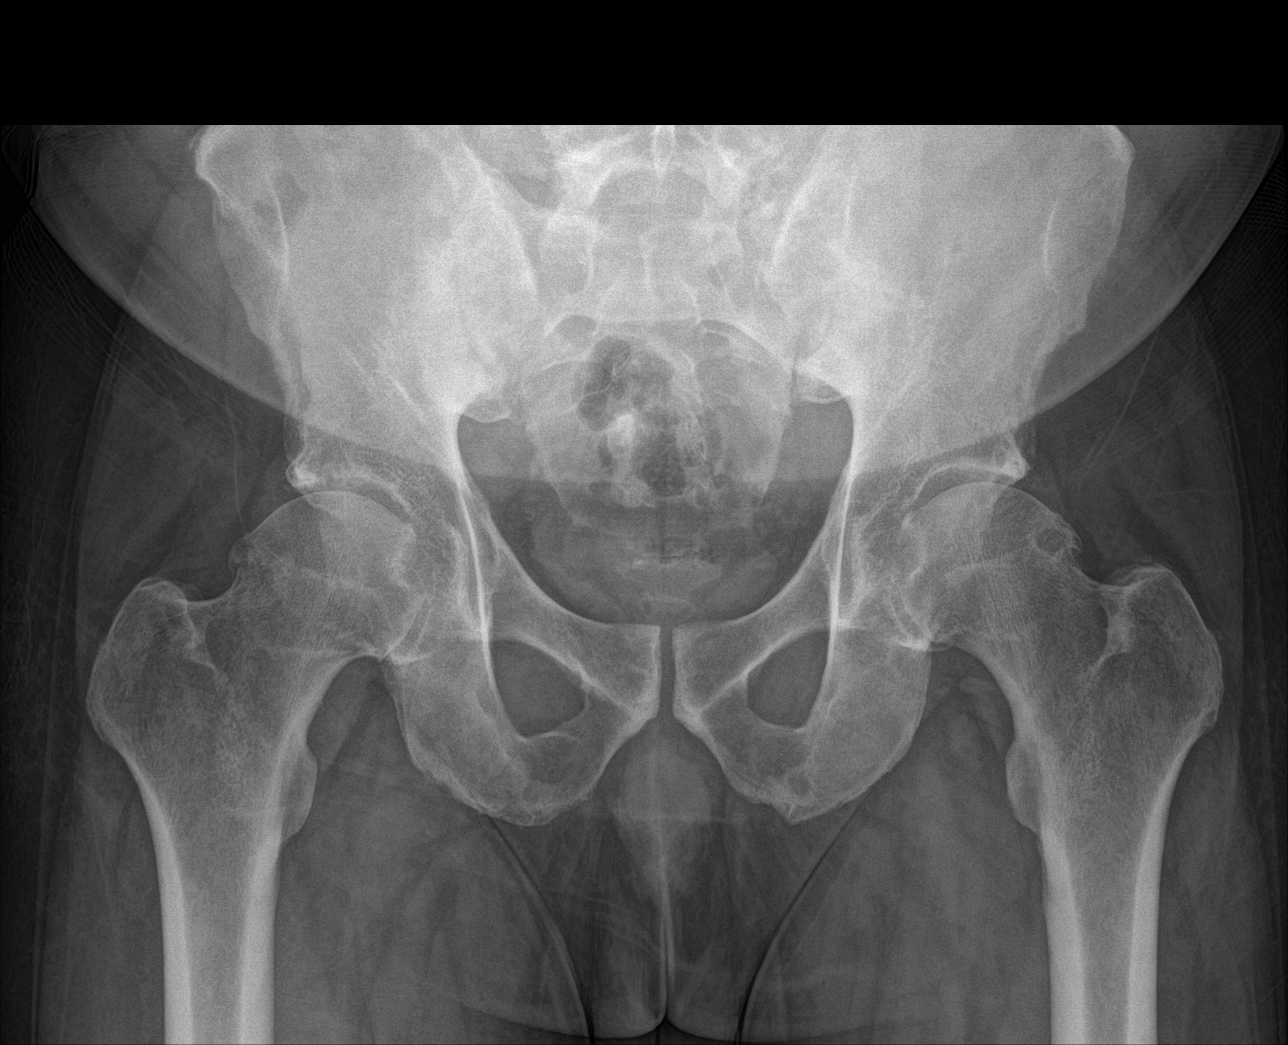

[hip ap]
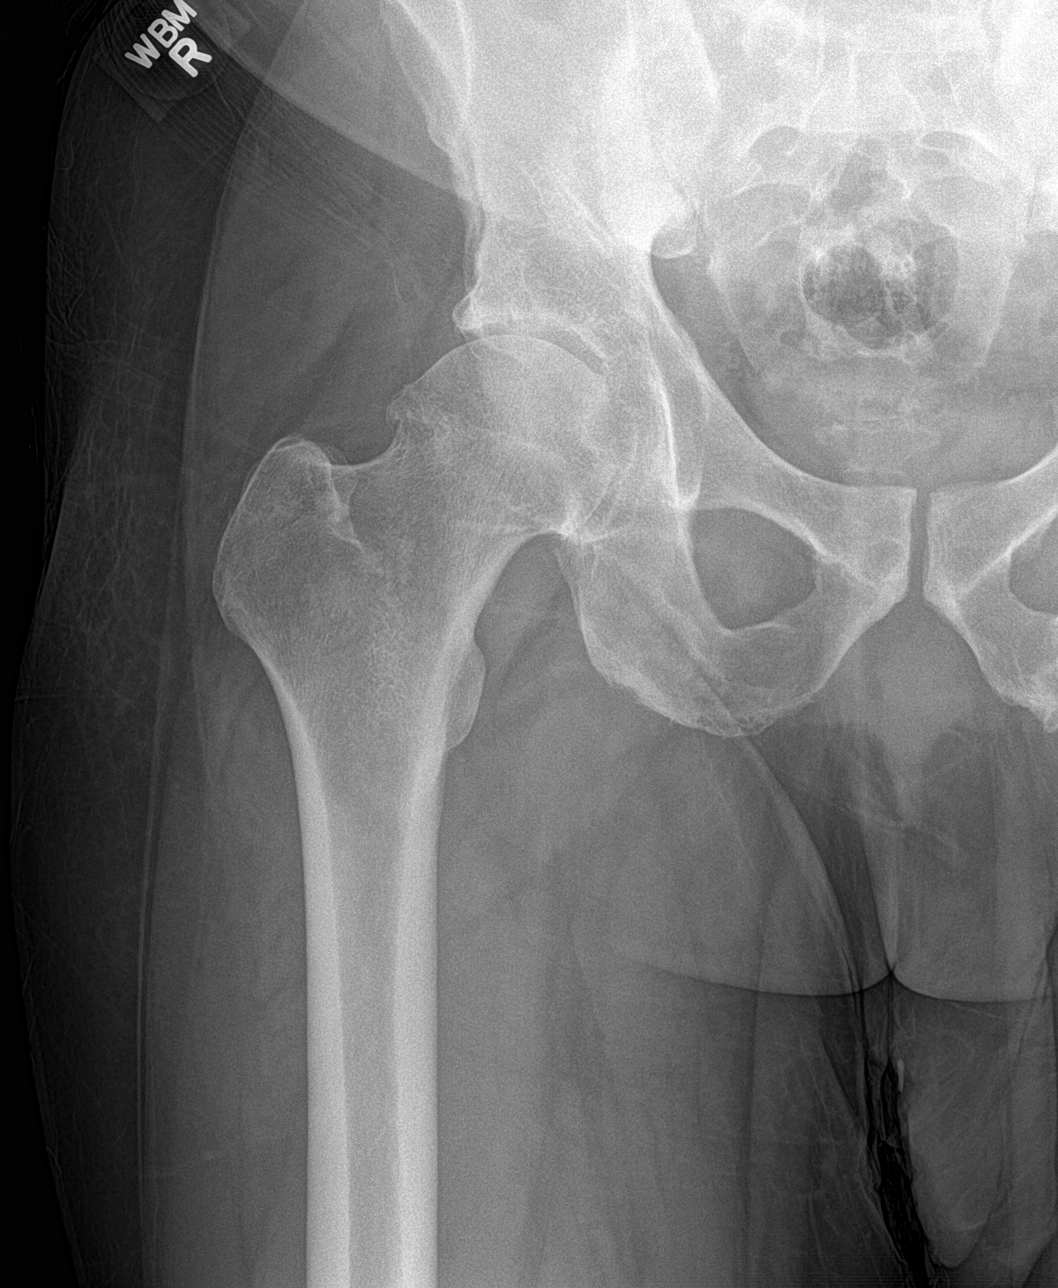

[hip lat]
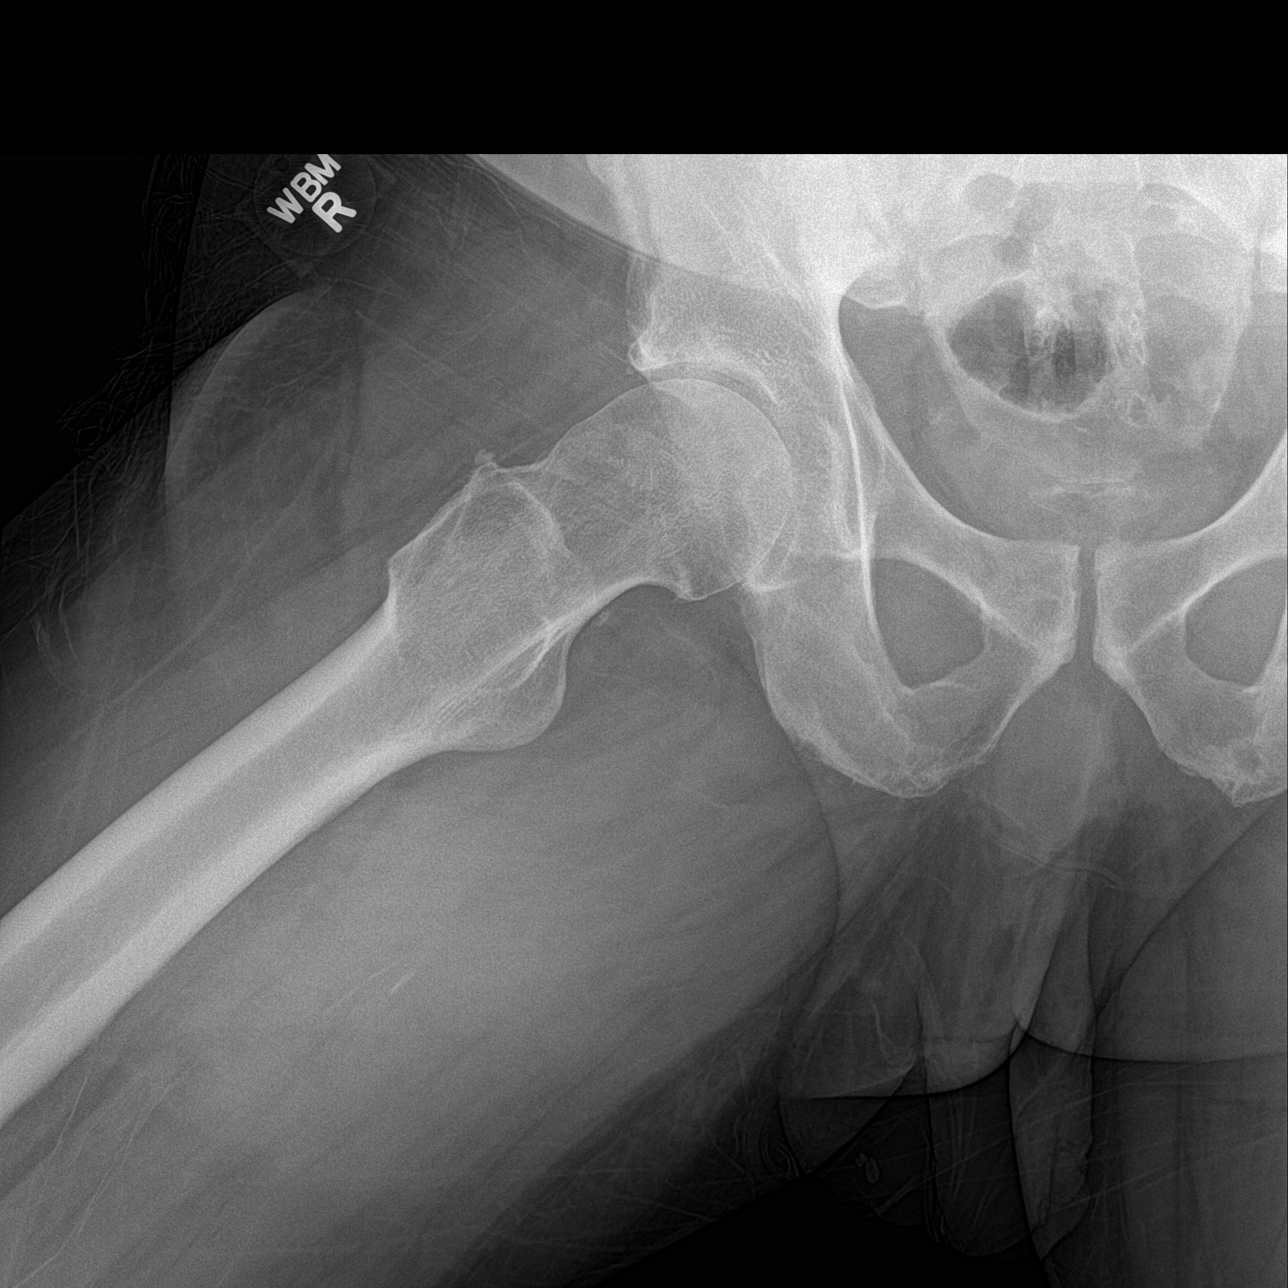

[3 of 3 positions shown; findings below may reference images not displayed]

FINDINGS: Mild-to-moderate left and mild right femoroacetabular joint space
narrowing. Moderate bilateral femoral head-neck junction
circumferential degenerative osteophytes. Left-greater-than-right
superolateral acetabular degenerative cystic change. The pubic
symphysis joint space is maintained. Moderate to high-grade
symmetric sclerosis on the iliac greater than sacral sides of the
bilateral sacroiliac joints with mild bilateral joint space
narrowing. Surgical clips overlie the superior right ilium. No acute
fracture or dislocation.
IMPRESSION: :
IMPRESSION: 1. There is again symmetric moderate to high-grade bilateral
sacroiliac subchondral sclerosis as can be seen with sacroiliitis.
This can be seen in the setting of ankylosing spondylitis and
inflammatory bowel disease.
2. Moderate bilateral femoroacetabular osteoarthritis.

## 2022-09-02 NOTE — Telephone Encounter (Signed)
Called patient again and had to leave him another message.

## 2022-11-17 ENCOUNTER — Other Ambulatory Visit: Payer: BC Managed Care – PPO

## 2022-11-19 ENCOUNTER — Encounter: Payer: Self-pay | Admitting: Family Medicine

## 2022-11-19 DIAGNOSIS — L0201 Cutaneous abscess of face: Secondary | ICD-10-CM

## 2022-11-19 MED ORDER — AMOXICILLIN-POT CLAVULANATE 875-125 MG PO TABS: 1.0000 | ORAL_TABLET | Freq: Two times a day (BID) | ORAL | 0 refills | Status: DC

## 2022-11-24 ENCOUNTER — Ambulatory Visit: Payer: BC Managed Care – PPO | Admitting: Family Medicine

## 2022-12-02 DIAGNOSIS — T783XXD Angioneurotic edema, subsequent encounter: Secondary | ICD-10-CM | POA: Diagnosis not present

## 2022-12-24 ENCOUNTER — Other Ambulatory Visit: Payer: Self-pay | Admitting: Family Medicine

## 2022-12-24 DIAGNOSIS — M159 Polyosteoarthritis, unspecified: Secondary | ICD-10-CM

## 2022-12-24 DIAGNOSIS — G8929 Other chronic pain: Secondary | ICD-10-CM

## 2022-12-24 DIAGNOSIS — M545 Low back pain, unspecified: Secondary | ICD-10-CM

## 2022-12-25 NOTE — Telephone Encounter (Signed)
Requested medications are due for refill today.  yes  Requested medications are on the active medications list.  yes  Last refill. 06/26/2022 #90 1 rf  Future visit scheduled.   yes  Notes to clinic.  Labs are expired.    Requested Prescriptions  Pending Prescriptions Disp Refills   meloxicam (MOBIC) 15 MG tablet [Pharmacy Med Name: MELOXICAM 15 MG TABLET] 90 tablet 1    Sig: TAKE 1 TABLET BY MOUTH EVERY DAY AS NEEDED FOR PAIN     Analgesics:  COX2 Inhibitors Failed - 12/24/2022  2:14 AM      Failed - Manual Review: Labs are only required if the patient has taken medication for more than 8 weeks.      Failed - HGB in normal range and within 360 days    Hemoglobin  Date Value Ref Range Status  09/13/2021 15.0 13.2 - 17.1 g/dL Final         Failed - Cr in normal range and within 360 days    Creat  Date Value Ref Range Status  09/13/2021 0.81 0.60 - 1.29 mg/dL Final         Failed - HCT in normal range and within 360 days    HCT  Date Value Ref Range Status  09/13/2021 43.7 38.5 - 50.0 % Final         Failed - AST in normal range and within 360 days    AST  Date Value Ref Range Status  09/13/2021 18 10 - 40 U/L Final         Failed - ALT in normal range and within 360 days    ALT  Date Value Ref Range Status  09/13/2021 18 9 - 46 U/L Final         Failed - eGFR is 30 or above and within 360 days    GFR calc Af Amer  Date Value Ref Range Status  12/21/2019 >60 >60 mL/min Final   GFR, Estimated  Date Value Ref Range Status  08/17/2020 >60 >60 mL/min Final    Comment:    (NOTE) Calculated using the CKD-EPI Creatinine Equation (2021)    eGFR  Date Value Ref Range Status  09/13/2021 108 > OR = 60 mL/min/1.2m2 Final    Comment:    The eGFR is based on the CKD-EPI 2021 equation. To calculate  the new eGFR from a previous Creatinine or Cystatin C result, go to https://www.kidney.org/professionals/ kdoqi/gfr%[email or web address]          Passed - Patient is not  pregnant      Passed - Valid encounter within last 12 months    Recent Outpatient Visits           4 months ago Essential hypertension   [place] [facility] Smitty Cords, DO   7 months ago Essential hypertension   [place] [facility] Smitty Cords, DO   8 months ago Chronic right-sided low back pain without sciatica   [place] [facility] Smitty Cords, DO   11 months ago Chronic right-sided low back pain without sciatica   [place] [facility] Smitty Cords, DO   1 year ago Primary osteoarthritis involving multiple joints    [place] [facility] Smitty Cords, DO       Future Appointments             In 3 weeks Althea Charon Netta Neat, DO [facility], [place]

## 2023-01-13 ENCOUNTER — Other Ambulatory Visit: Payer: BC Managed Care – PPO

## 2023-01-13 DIAGNOSIS — I10 Essential (primary) hypertension: Secondary | ICD-10-CM | POA: Diagnosis not present

## 2023-01-13 DIAGNOSIS — R7303 Prediabetes: Secondary | ICD-10-CM

## 2023-01-13 DIAGNOSIS — E786 Lipoprotein deficiency: Secondary | ICD-10-CM | POA: Diagnosis not present

## 2023-01-13 DIAGNOSIS — Z125 Encounter for screening for malignant neoplasm of prostate: Secondary | ICD-10-CM | POA: Diagnosis not present

## 2023-01-13 DIAGNOSIS — Z0000 Encounter for general adult medical examination without abnormal findings: Secondary | ICD-10-CM

## 2023-01-14 LAB — CBC WITH DIFFERENTIAL/PLATELET
Absolute Monocytes: 860 {cells}/uL (ref 200–950)
Basophils Absolute: 50 {cells}/uL (ref 0–200)
Basophils Relative: 0.5 %
Eosinophils Absolute: 370 {cells}/uL (ref 15–500)
Eosinophils Relative: 3.7 %
HCT: 46.9 % (ref 38.5–50.0)
Hemoglobin: 15.8 g/dL (ref 13.2–17.1)
Lymphs Abs: 4800 {cells}/uL — ABNORMAL HIGH (ref 850–3900)
MCH: 28.1 pg (ref 27.0–33.0)
MCHC: 33.7 g/dL (ref 32.0–36.0)
MCV: 83.3 fL (ref 80.0–100.0)
MPV: 10.8 fL (ref 7.5–12.5)
Monocytes Relative: 8.6 %
Neutro Abs: 3920 {cells}/uL (ref 1500–7800)
Neutrophils Relative %: 39.2 %
Platelets: 244 10*3/uL (ref 140–400)
RBC: 5.63 10*6/uL (ref 4.20–5.80)
RDW: 14.6 % (ref 11.0–15.0)
Total Lymphocyte: 48 %
WBC: 10 10*3/uL (ref 3.8–10.8)

## 2023-01-14 LAB — COMPLETE METABOLIC PANEL WITH GFR
AG Ratio: 1.1 (calc) (ref 1.0–2.5)
ALT: 29 U/L (ref 9–46)
AST: 29 U/L (ref 10–35)
Albumin: 4.2 g/dL (ref 3.6–5.1)
Alkaline phosphatase (APISO): 57 U/L (ref 35–144)
BUN: 16 mg/dL (ref 7–25)
CO2: 27 mmol/L (ref 20–32)
Calcium: 9.3 mg/dL (ref 8.6–10.3)
Chloride: 100 mmol/L (ref 98–110)
Creat: 0.99 mg/dL (ref 0.70–1.30)
Globulin: 3.7 g/dL (ref 1.9–3.7)
Glucose, Bld: 100 mg/dL — ABNORMAL HIGH (ref 65–99)
Potassium: 4.3 mmol/L (ref 3.5–5.3)
Sodium: 138 mmol/L (ref 135–146)
Total Bilirubin: 0.6 mg/dL (ref 0.2–1.2)
Total Protein: 7.9 g/dL (ref 6.1–8.1)
eGFR: 93 mL/min/{1.73_m2} (ref >=60)

## 2023-01-14 LAB — HEMOGLOBIN A1C
Hgb A1c MFr Bld: 6 %{Hb} — ABNORMAL HIGH (ref <5.7)
Mean Plasma Glucose: 126 mg/dL
eAG (mmol/L): 7 mmol/L

## 2023-01-14 LAB — LIPID PANEL
Cholesterol: 158 mg/dL (ref <200)
HDL: 35 mg/dL — ABNORMAL LOW (ref >=40)
LDL Cholesterol (Calc): 90 mg/dL
Non-HDL Cholesterol (Calc): 123 mg/dL (ref <130)
Total CHOL/HDL Ratio: 4.5 (calc) (ref <5.0)
Triglycerides: 253 mg/dL — ABNORMAL HIGH (ref <150)

## 2023-01-14 LAB — PSA: PSA: 0.21 ng/mL (ref <=4.00)

## 2023-01-14 LAB — TSH: TSH: 3.77 m[IU]/L (ref 0.40–4.50)

## 2023-01-20 ENCOUNTER — Ambulatory Visit (INDEPENDENT_AMBULATORY_CARE_PROVIDER_SITE_OTHER): Payer: BC Managed Care – PPO | Admitting: Family Medicine

## 2023-01-20 ENCOUNTER — Encounter: Payer: Self-pay | Admitting: Family Medicine

## 2023-01-20 VITALS — BP 132/86 | HR 88 | Ht 68.31 in | Wt 230.0 lb

## 2023-01-20 DIAGNOSIS — R7303 Prediabetes: Secondary | ICD-10-CM

## 2023-01-20 DIAGNOSIS — I10 Essential (primary) hypertension: Secondary | ICD-10-CM

## 2023-01-20 DIAGNOSIS — Z122 Encounter for screening for malignant neoplasm of respiratory organs: Secondary | ICD-10-CM

## 2023-01-20 DIAGNOSIS — Z0000 Encounter for general adult medical examination without abnormal findings: Secondary | ICD-10-CM | POA: Diagnosis not present

## 2023-01-20 DIAGNOSIS — Z720 Tobacco use: Secondary | ICD-10-CM

## 2023-01-20 DIAGNOSIS — Z23 Encounter for immunization: Secondary | ICD-10-CM

## 2023-01-20 NOTE — Assessment & Plan Note (Signed)
Improved BP - Home BP readings reviewed  No known complications Failed Amlodipine due to edema    Plan:  1. Continue current BP regimen HCTZ 25mg  daily - Consider Chlorthalidone in future for inc potency 2. Encourage improved lifestyle - low sodium diet, regular exercise 3. Continue monitor BP outside office, bring readings to next visit, if persistently >140/90 or new symptoms notify office sooner

## 2023-01-20 NOTE — Assessment & Plan Note (Signed)
Improved A1c to 6.0  Plan:  1. Not on any therapy currently  2. Encourage improved lifestyle - low carb, low sugar diet, reduce portion size, continue improving regular exercise 3. Follow-up 6 months

## 2023-01-20 NOTE — Progress Notes (Signed)
Subjective:    Patient ID: Aaron Woodard, male    DOB: 03-22-1971, 52 y.o.   MRN: [id number]  Aaron Woodard is a 52 y.o. male presenting on 01/20/2023 for Annual Exam   HPI  Here for Annual Physical and Lab Review  CHRONIC HTN: Home BP readings, previously controlled. None recently. Current Meds - HCTZ 25mg  daily No side effect or issue Denies CP, dyspnea, HA, edema, dizziness / lightheadedness   Elevated A1c / Pre Diabetes A1c down from 6.3 down to 6.0 with improved lifestyle, he has reduced sodas and alcohol significantly. Meds: none Lifestyle: - Diet (goal to limit carb starches, also drinking more alcohol lately)  Denies hypoglycemia, polyuria, visual changes, numbness or tingling.   Erectile Dysfunction Failed Sildenafil Improved on Tadalafil 20mg  but seems unpredictable at times, may not be as effective. Contact if want to refer to Urologist.  Osteoarthritis multiple joints Doing well with medication management Taking Gabapentin 100-300mg  at night, does feel sleepy Taking Baclofen 10mg  2 to 3 times   Hypertriglyceridemia Last lab shows TG 253, and LDL 90 LDL is at goal Not on statin therapy    Health Maintenance:  PSA 0.21, negative.      01/20/2023    3:23 PM 08/19/2022    8:26 AM 03/31/2022    3:03 PM  Depression screen PHQ 2/9  Decreased Interest 0 0 0  Down, Depressed, Hopeless 0 0 0  PHQ - 2 Score 0 0 0  Altered sleeping  1 0  Tired, decreased energy  1 1  Change in appetite  0 0  Feeling bad or failure about yourself   0 0  Trouble concentrating  0 0  Moving slowly or fidgety/restless  0 0  Suicidal thoughts  0 0  PHQ-9 Score  2 1  Difficult doing work/chores  Not difficult at all Not difficult at all    Past Medical History:  Diagnosis Date   Acute appendicitis with localized peritonitis    Allergy    Appendicitis, acute 10/04/2016   Past Surgical History:  Procedure Laterality Date   APPENDECTOMY     COLONOSCOPY WITH PROPOFOL N/A  06/18/2022   Procedure: COLONOSCOPY WITH PROPOFOL;  Surgeon: Wyline Mood, MD;  Location: [facility] ENDOSCOPY;  Service: Gastroenterology;  Laterality: N/A;   LAPAROSCOPIC APPENDECTOMY N/A 10/04/2016   Procedure: APPENDECTOMY LAPAROSCOPIC;  Surgeon: Lattie Haw, MD;  Location: [facility] ORS;  Service: General;  Laterality: N/A;   Social History   Socioeconomic History   Marital status: Married    Spouse name: Not on file   Number of children: Not on file   Years of education: Not on file   Highest education level: Some college, no degree  Occupational History   Not on file  Tobacco Use   Smoking status: Every Day    Current packs/day: 0.50    Average packs/day: 0.5 packs/day for 15.0 years (7.5 ttl pk-yrs)    Types: Cigarettes   Smokeless tobacco: Never  Vaping Use   Vaping status: Never Used  Substance and Sexual Activity   Alcohol use: Yes    Alcohol/week: 5.0 - 6.0 standard drinks of alcohol    Types: 5 - 6 Standard drinks or equivalent per week    Comment: 2-3 Beers Daily   Drug use: No   Sexual activity: Yes  Other Topics Concern   Not on file  Social History Narrative   Not on file   Social Determinants of Health   Financial Resource Strain: Low Risk  (  08/15/2022)   Overall Financial Resource Strain (CARDIA)    Difficulty of Paying Living Expenses: Not hard at all  Food Insecurity: No Food Insecurity (08/15/2022)   Hunger Vital Sign    Worried About Running Out of Food in the Last Year: Never true    Ran Out of Food in the Last Year: Never true  Transportation Needs: No Transportation Needs (08/15/2022)   PRAPARE - Administrator, Civil Service (Medical): No    Lack of Transportation (Non-Medical): No  Physical Activity: Insufficiently Active (08/15/2022)   Exercise Vital Sign    Days of Exercise per Week: 1 day    Minutes of Exercise per Session: 20 min  Stress: Stress Concern Present (08/15/2022)   [employer] of Occupational Health - Occupational  Stress Questionnaire    Feeling of Stress : To some extent  Social Connections: Moderately Isolated (08/15/2022)   Social Connection and Isolation Panel [NHANES]    Frequency of Communication with Friends and Family: Once a week    Frequency of Social Gatherings with Friends and Family: Never    Attends Religious Services: 1 to 4 times per year    Active Member of [employer] or Organizations: No    Attends Engineer, structural: Not on file    Marital Status: Married  Catering manager Violence: Not on file   Family History  Problem Relation Age of Onset   Diabetes Mother    Hypertension Mother    Heart disease Mother    Heart failure Mother    Healthy Father    Healthy Sister    Diabetes Maternal Aunt    Diabetes Cousin    Current Outpatient Medications on File Prior to Visit  Medication Sig   baclofen (LIORESAL) 10 MG tablet Take 1 tablet (10 mg total) by mouth 3 (three) times daily.   cetirizine (ZYRTEC) 10 MG tablet Take 1 tablet (10 mg total) by mouth at bedtime.   EPINEPHrine 0.3 mg/0.3 mL IJ SOAJ injection Inject 0.3 mg into the muscle as needed for anaphylaxis.   gabapentin (NEURONTIN) 300 MG capsule Take 1 capsule (300 mg total) by mouth at bedtime. (Patient taking differently: Take 300 mg by mouth at bedtime. PRN)   hydrochlorothiazide (HYDRODIURIL) 25 MG tablet Take 1 tablet (25 mg total) by mouth daily.   meloxicam (MOBIC) 15 MG tablet TAKE 1 TABLET BY MOUTH EVERY DAY AS NEEDED FOR PAIN   omalizumab (XOLAIR) 150 MG injection Inject 150 mg into the skin every 28 (twenty-eight) days.   tadalafil (CIALIS) 20 MG tablet Take 1 tablet (20 mg total) by mouth every other day as needed for erectile dysfunction.   No current facility-administered medications on file prior to visit.    Review of Systems  Constitutional:  Negative for activity change, appetite change, chills, diaphoresis, fatigue and fever.  HENT:  Negative for congestion and hearing loss.   Eyes:  Negative  for visual disturbance.  Respiratory:  Negative for cough, chest tightness, shortness of breath and wheezing.   Cardiovascular:  Negative for chest pain, palpitations and leg swelling.  Gastrointestinal:  Negative for abdominal pain, constipation, diarrhea, nausea and vomiting.  Genitourinary:  Negative for dysuria, frequency and hematuria.  Musculoskeletal:  Negative for arthralgias and neck pain.  Skin:  Negative for rash.  Neurological:  Negative for dizziness, weakness, light-headedness, numbness and headaches.  Hematological:  Negative for adenopathy.  Psychiatric/Behavioral:  Negative for behavioral problems, dysphoric mood and sleep disturbance.    Per HPI unless  specifically indicated above     Objective:    BP 132/86   Pulse 88   Ht 5' 8.31" (1.735 m)   Wt 230 lb (104.3 kg)   SpO2 96%   BMI 34.66 kg/m   Wt Readings from Last 3 Encounters:  01/20/23 230 lb (104.3 kg)  08/19/22 234 lb (106.1 kg)  06/18/22 228 lb 7.4 oz (103.6 kg)    Physical Exam Vitals and nursing note reviewed.  Constitutional:      General: He is not in acute distress.    Appearance: He is well-developed. He is not diaphoretic.     Comments: Well-appearing, comfortable, cooperative  HENT:     Head: Normocephalic and atraumatic.  Eyes:     General:        Right eye: No discharge.        Left eye: No discharge.     Conjunctiva/sclera: Conjunctivae normal.     Pupils: Pupils are equal, round, and reactive to light.  Neck:     Thyroid: No thyromegaly.     Vascular: No carotid bruit.  Cardiovascular:     Rate and Rhythm: Normal rate and regular rhythm.     Pulses: Normal pulses.     Heart sounds: Normal heart sounds. No murmur heard. Pulmonary:     Effort: Pulmonary effort is normal. No respiratory distress.     Breath sounds: Normal breath sounds. No wheezing or rales.  Abdominal:     General: Bowel sounds are normal. There is no distension.     Palpations: Abdomen is soft. There is no  mass.     Tenderness: There is no abdominal tenderness.  Musculoskeletal:        General: No tenderness. Normal range of motion.     Cervical back: Normal range of motion and neck supple.     Right lower leg: No edema.     Left lower leg: No edema.     Comments: Upper / Lower Extremities: - Normal muscle tone, strength bilateral upper extremities 5/5, lower extremities 5/5  Lymphadenopathy:     Cervical: No cervical adenopathy.  Skin:    General: Skin is warm and dry.     Findings: No erythema or rash.  Neurological:     Mental Status: He is alert and oriented to person, place, and time.     Comments: Distal sensation intact to light touch all extremities  Psychiatric:        Mood and Affect: Mood normal.        Behavior: Behavior normal.        Thought Content: Thought content normal.     Comments: Well groomed, good eye contact, normal speech and thoughts       Results for orders placed or performed in visit on 01/13/23  TSH  Result Value Ref Range   TSH 3.77 0.40 - 4.50 mIU/L  PSA  Result Value Ref Range   PSA 0.21 < OR = 4.00 ng/mL  Hemoglobin A1c  Result Value Ref Range   Hgb A1c MFr Bld 6.0 (H) <5.7 % of total Hgb   Mean Plasma Glucose 126 mg/dL   eAG (mmol/L) 7.0 mmol/L  Lipid panel  Result Value Ref Range   Cholesterol 158 <200 mg/dL   HDL 35 (L) > OR = 40 mg/dL   Triglycerides 161 (H) <150 mg/dL   LDL Cholesterol (Calc) 90 mg/dL (calc)   Total CHOL/HDL Ratio 4.5 <5.0 (calc)   Non-HDL Cholesterol (Calc) 123 <130 mg/dL (calc)  CBC with Differential/Platelet  Result Value Ref Range   WBC 10.0 3.8 - 10.8 Thousand/uL   RBC 5.63 4.20 - 5.80 Million/uL   Hemoglobin 15.8 13.2 - 17.1 g/dL   HCT 16.1 09.6 - 04.5 %   MCV 83.3 80.0 - 100.0 fL   MCH 28.1 27.0 - 33.0 pg   MCHC 33.7 32.0 - 36.0 g/dL   RDW 40.9 81.1 - 91.4 %   Platelets 244 140 - 400 Thousand/uL   MPV 10.8 7.5 - 12.5 fL   Neutro Abs 3,920 1,500 - 7,800 cells/uL   Lymphs Abs 4,800 (H) 850 - 3,900  cells/uL   Absolute Monocytes 860 200 - 950 cells/uL   Eosinophils Absolute 370 15 - 500 cells/uL   Basophils Absolute 50 0 - 200 cells/uL   Neutrophils Relative % 39.2 %   Total Lymphocyte 48.0 %   Monocytes Relative 8.6 %   Eosinophils Relative 3.7 %   Basophils Relative 0.5 %  COMPLETE METABOLIC PANEL WITH GFR  Result Value Ref Range   Glucose, Bld 100 (H) 65 - 99 mg/dL   BUN 16 7 - 25 mg/dL   Creat 7.82 9.56 - 2.13 mg/dL   eGFR 93 > OR = 60 YQ/MVH/8.46N6   BUN/Creatinine Ratio SEE NOTE: 6 - 22 (calc)   Sodium 138 135 - 146 mmol/L   Potassium 4.3 3.5 - 5.3 mmol/L   Chloride 100 98 - 110 mmol/L   CO2 27 20 - 32 mmol/L   Calcium 9.3 8.6 - 10.3 mg/dL   Total Protein 7.9 6.1 - 8.1 g/dL   Albumin 4.2 3.6 - 5.1 g/dL   Globulin 3.7 1.9 - 3.7 g/dL (calc)   AG Ratio 1.1 1.0 - 2.5 (calc)   Total Bilirubin 0.6 0.2 - 1.2 mg/dL   Alkaline phosphatase (APISO) 57 35 - 144 U/L   AST 29 10 - 35 U/L   ALT 29 9 - 46 U/L      Assessment & Plan:   Problem List Items Addressed This Visit     Essential hypertension    Improved BP - Home BP readings reviewed  No known complications Failed Amlodipine due to edema    Plan:  1. Continue current BP regimen HCTZ 25mg  daily - Consider Chlorthalidone in future for inc potency 2. Encourage improved lifestyle - low sodium diet, regular exercise 3. Continue monitor BP outside office, bring readings to next visit, if persistently >140/90 or new symptoms notify office sooner      Pre-diabetes    Improved A1c to 6.0  Plan:  1. Not on any therapy currently  2. Encourage improved lifestyle - low carb, low sugar diet, reduce portion size, continue improving regular exercise 3. Follow-up 6 months       Other Visit Diagnoses     Annual physical exam    -  Primary   Needs flu shot       Need for immunization against influenza       Relevant Orders   Flu vaccine trivalent PF, 6mos and older(Flulaval,Afluria,Fluarix,Fluzone) (Completed)    Screening for lung cancer       Relevant Orders   Ambulatory Referral Lung Cancer Screening [facility]   Tobacco abuse       Relevant Orders   Ambulatory Referral Lung Cancer Screening [facility]       Updated Health Maintenance information Reviewed recent lab results with patient Encouraged improvement to lifestyle with diet and exercise Goal of weight loss  Erectile Dysfunction  Has not tried Rx medications Reviewed lifestyle factors that can improve this issue Goal to limit alcohol Let me know if you want to follow-up with Urologist in the future we can refer you, if it is within next 6 months, if you need to follow with me again can schedule if need.  Keep improving diet and lowering sugar / sodas, and alcohol.  Continue current medicines  Flu Shot today  Shingles vaccine when ready, check with pharmacy / insurance for cost. 2 doses 2-6 month apart.  Orders Placed This Encounter  Procedures   Flu vaccine trivalent PF, 6mos and older(Flulaval,Afluria,Fluarix,Fluzone)   Ambulatory Referral Lung Cancer Screening [facility]    Referral Priority:   Routine    Referral Type:   Consultation    Referral Reason:   Specialty Services Required    Number of Visits Requested:   1     No orders of the defined types were placed in this encounter.     Follow up plan: Return in about 6 months (around 07/20/2023) for 6 month PreDM A1c, HTN, updates.  Saralyn Pilar, DO [facility] 01/20/2023, 3:25 PM

## 2023-01-20 NOTE — Patient Instructions (Addendum)
Thank you for coming to the office today.  Keep up the great work.  Recent Labs    05/13/22 0820 08/11/22 0756 01/13/23 0757  HGBA1C 6.3* 6.3* 6.0*   Let me know if you want to follow-up with Urologist in the future we can refer you, if it is within next 6 months, if you need to follow with me again can schedule if need.  Keep improving diet and lowering sugar / sodas, and alcohol.  Continue current medicines  Flu Shot today  Shingles vaccine when ready, check with pharmacy / insurance for cost.  2 doses 2-6 month apart.  Please schedule a Follow-up Appointment to: Return in about 6 months (around 07/20/2023) for 6 month PreDM A1c, HTN, updates.  If you have any other questions or concerns, please feel free to call the office or send a message through MyChart. You may also schedule an earlier appointment if necessary.  Additionally, you may be receiving a survey about your experience at our office within a few days to 1 week by e-mail or mail. We value your feedback.  Saralyn Pilar, DO [facility], [place]

## 2023-01-30 ENCOUNTER — Encounter: Payer: Self-pay | Admitting: Family Medicine

## 2023-01-30 DIAGNOSIS — E786 Lipoprotein deficiency: Secondary | ICD-10-CM

## 2023-01-30 DIAGNOSIS — I10 Essential (primary) hypertension: Secondary | ICD-10-CM

## 2023-02-04 ENCOUNTER — Telehealth: Payer: Self-pay

## 2023-02-04 NOTE — Telephone Encounter (Signed)
Spoke to patient by phone to gather smoking history for lung cancer screening LDCT.  Patient began smoking cigarettes around age [age] years old.  States he 'never smoked more than 1/2 PPD' except on rare occasion at social event, while drinking alcohol.  His pack years do not meet criteria of 20.  Age 31-age 80 began smoking x 1/2 PPD = 12 pack years.  Explained to patient this does not meet minimum pack years.  Patient voiced understanding but states he was a light smoker and was firm in his pack year history.  Patient would be interested in alternative scan, such as CT chest, if recommended by referring provider.  Will route message to PCP for review and recommendation to patient.  Referral has been closed

## 2023-02-05 NOTE — Addendum Note (Signed)
Addended by: Smitty Cords on: 02/05/2023 04:50 PM   Modules accepted: Orders

## 2023-02-06 ENCOUNTER — Other Ambulatory Visit: Payer: Self-pay | Admitting: Family Medicine

## 2023-02-06 DIAGNOSIS — M159 Polyosteoarthritis, unspecified: Secondary | ICD-10-CM

## 2023-02-06 DIAGNOSIS — G8929 Other chronic pain: Secondary | ICD-10-CM

## 2023-02-09 NOTE — Telephone Encounter (Signed)
Last RF 08/19/22 #270 3 RF   Requested Prescriptions  Refused Prescriptions Disp Refills   baclofen (LIORESAL) 10 MG tablet [Pharmacy Med Name: BACLOFEN 10 MG TABLET] 180 tablet     Sig: TAKE 1 TABLET BY MOUTH TWICE A DAY     Analgesics:  Muscle Relaxants - baclofen Passed - 02/06/2023  1:40 AM      Passed - Cr in normal range and within 180 days    Creat  Date Value Ref Range Status  01/13/2023 0.99 0.70 - 1.30 mg/dL Final         Passed - eGFR is 30 or above and within 180 days    GFR calc Af Amer  Date Value Ref Range Status  12/21/2019 >60 >60 mL/min Final   GFR, Estimated  Date Value Ref Range Status  08/17/2020 >60 >60 mL/min Final    Comment:    (NOTE) Calculated using the CKD-EPI Creatinine Equation (2021)    eGFR  Date Value Ref Range Status  01/13/2023 93 > OR = 60 mL/min/1.59m2 Final         Passed - Valid encounter within last 6 months    Recent Outpatient Visits           2 weeks ago Annual physical exam   [place] [facility] Smitty Cords, DO   5 months ago Essential hypertension   [place] [facility] Smitty Cords, DO   9 months ago Essential hypertension   [place] [facility] Smitty Cords, DO   10 months ago Chronic right-sided low back pain without sciatica   [place] [facility] Smitty Cords, DO   1 year ago Chronic right-sided low back pain without sciatica   [place] [facility] Althea Charon, Netta Neat, DO       Future Appointments             In 5 months Althea Charon, Netta Neat, DO [place] [facility]

## 2023-03-13 ENCOUNTER — Other Ambulatory Visit: Payer: Self-pay | Admitting: Family Medicine

## 2023-03-13 ENCOUNTER — Ambulatory Visit
Admission: RE | Admit: 2023-03-13 | Discharge: 2023-03-13 | Disposition: A | Discharge status: Home / Self Care | Payer: BC Managed Care – PPO | Source: Ambulatory Visit | Attending: Family Medicine | Admitting: Family Medicine

## 2023-03-13 DIAGNOSIS — E786 Lipoprotein deficiency: Secondary | ICD-10-CM | POA: Insufficient documentation

## 2023-03-13 DIAGNOSIS — E785 Hyperlipidemia, unspecified: Secondary | ICD-10-CM

## 2023-03-13 DIAGNOSIS — I10 Essential (primary) hypertension: Secondary | ICD-10-CM | POA: Insufficient documentation

## 2023-03-13 DIAGNOSIS — R931 Abnormal findings on diagnostic imaging of heart and coronary circulation: Secondary | ICD-10-CM

## 2023-03-13 MED ORDER — ATORVASTATIN CALCIUM 20 MG PO TABS
20.0000 mg | ORAL_TABLET | Freq: Every day | ORAL | 3 refills | Status: DC
Start: 2023-03-13 — End: 2023-07-10

## 2023-03-16 DIAGNOSIS — H5213 Myopia, bilateral: Secondary | ICD-10-CM | POA: Diagnosis not present

## 2023-03-16 DIAGNOSIS — H1045 Other chronic allergic conjunctivitis: Secondary | ICD-10-CM | POA: Diagnosis not present

## 2023-03-16 DIAGNOSIS — H40013 Open angle with borderline findings, low risk, bilateral: Secondary | ICD-10-CM | POA: Diagnosis not present

## 2023-05-29 ENCOUNTER — Ambulatory Visit: Payer: BC Managed Care – PPO | Admitting: Cardiovascular Disease

## 2023-06-09 DIAGNOSIS — T783XXD Angioneurotic edema, subsequent encounter: Secondary | ICD-10-CM | POA: Diagnosis not present

## 2023-06-25 ENCOUNTER — Other Ambulatory Visit: Payer: Self-pay | Admitting: Family Medicine

## 2023-06-25 DIAGNOSIS — I10 Essential (primary) hypertension: Secondary | ICD-10-CM

## 2023-06-26 NOTE — Telephone Encounter (Signed)
 Requested Prescriptions  Pending Prescriptions Disp Refills   hydrochlorothiazide  (HYDRODIURIL ) 25 MG tablet [Pharmacy Med Name: HYDROCHLOROTHIAZIDE  25 MG TAB] 90 tablet 0    Sig: TAKE 1 TABLET (25 MG TOTAL) BY MOUTH DAILY.     Cardiovascular: Diuretics - Thiazide Passed - 06/26/2023  9:18 AM      Passed - Cr in normal range and within 180 days    Creat  Date Value Ref Range Status  01/13/2023 0.99 0.70 - 1.30 mg/dL Final         Passed - K in normal range and within 180 days    Potassium  Date Value Ref Range Status  01/13/2023 4.3 3.5 - 5.3 mmol/L Final         Passed - Na in normal range and within 180 days    Sodium  Date Value Ref Range Status  01/13/2023 138 135 - 146 mmol/L Final         Passed - Last BP in normal range    BP Readings from Last 1 Encounters:  01/20/23 132/86         Passed - Valid encounter within last 6 months    Recent Outpatient Visits           5 months ago Annual physical exam   [place] [facility] [place] Marsa PARAS, DO   10 months ago Essential hypertension   [place] [facility] [place] Marsa PARAS, DO   1 year ago Essential hypertension   [place] [facility] [place] Marsa PARAS, DO   1 year ago Chronic right-sided low back pain without sciatica   [place] [facility] [place] Marsa PARAS, DO   1 year ago Chronic right-sided low back pain without sciatica   [place] [facility] [place], Marsa PARAS, DO       Future Appointments             In 3 weeks Ed[place], Marsa PARAS, DO [place] [facility]   In 1 month Darron, Deatrice LABOR, MD [facility]

## 2023-07-09 ENCOUNTER — Encounter: Payer: Self-pay | Admitting: Family Medicine

## 2023-07-10 ENCOUNTER — Other Ambulatory Visit: Payer: Self-pay

## 2023-07-10 DIAGNOSIS — R931 Abnormal findings on diagnostic imaging of heart and coronary circulation: Secondary | ICD-10-CM

## 2023-07-10 DIAGNOSIS — E785 Hyperlipidemia, unspecified: Secondary | ICD-10-CM

## 2023-07-10 MED ORDER — ATORVASTATIN CALCIUM 20 MG PO TABS
20.0000 mg | ORAL_TABLET | Freq: Every day | ORAL | 3 refills | Status: AC
Start: 2023-07-10 — End: ?

## 2023-07-20 ENCOUNTER — Ambulatory Visit: Payer: Self-pay | Admitting: Family Medicine

## 2023-08-12 ENCOUNTER — Encounter: Payer: Self-pay | Admitting: Cardiovascular Disease

## 2023-08-12 ENCOUNTER — Ambulatory Visit: Payer: BC Managed Care – PPO | Attending: Cardiovascular Disease | Admitting: Cardiovascular Disease

## 2023-08-12 VITALS — BP 130/88 | HR 79 | Ht 70.0 in | Wt 231.8 lb

## 2023-08-12 DIAGNOSIS — E785 Hyperlipidemia, unspecified: Secondary | ICD-10-CM | POA: Diagnosis not present

## 2023-08-12 DIAGNOSIS — I10 Essential (primary) hypertension: Secondary | ICD-10-CM | POA: Diagnosis not present

## 2023-08-12 DIAGNOSIS — R931 Abnormal findings on diagnostic imaging of heart and coronary circulation: Secondary | ICD-10-CM | POA: Diagnosis not present

## 2023-08-12 DIAGNOSIS — R0609 Other forms of dyspnea: Secondary | ICD-10-CM | POA: Diagnosis not present

## 2023-08-12 DIAGNOSIS — Z720 Tobacco use: Secondary | ICD-10-CM

## 2023-08-12 NOTE — Progress Notes (Signed)
 Cardiology Office Note   Date:  08/12/2023   ID:  Aaron Woodard, DOB [birth year], MRN [id number]  PCP:  Smitty Cords, DO  Cardiologist:   Lorine Bears, MD   Chief Complaint  Patient presents with   Elevated coronary calcium score    Patient is doing ok on today. Meds reviewed.       History of Present Illness: Aaron Woodard is a 53 y.o. male who was referred by Dr. Althea Charon for evaluation of elevated coronary calcium score. He has known history of essential hypertension, tobacco use, obesity, hyperlipidemia and erectile dysfunction. He had CT calcium score in October 2024 which showed a score of 507.  Highest calcium concentration was in the LAD at 415.  He was started on aspirin and atorvastatin at that time. He denies chest pain but does complain of exertional dyspnea with no orthopnea, PND or lower extremity edema.  No lower extremity claudication.  He smokes half a pack per day.  He does have family history of coronary artery disease.  His mother had CABG.  He works for the city of [place].  He does not exercise on a regular basis.    Past Medical History:  Diagnosis Date   Acute appendicitis with localized peritonitis    Allergy    Appendicitis, acute 10/04/2016    Past Surgical History:  Procedure Laterality Date   APPENDECTOMY     COLONOSCOPY WITH PROPOFOL N/A 06/18/2022   Procedure: COLONOSCOPY WITH PROPOFOL;  Surgeon: Wyline Mood, MD;  Location: [facility] ENDOSCOPY;  Service: Gastroenterology;  Laterality: N/A;   LAPAROSCOPIC APPENDECTOMY N/A 10/04/2016   Procedure: APPENDECTOMY LAPAROSCOPIC;  Surgeon: Lattie Haw, MD;  Location: [facility] ORS;  Service: General;  Laterality: N/A;     Current Outpatient Medications  Medication Sig Dispense Refill   atorvastatin (LIPITOR) 20 MG tablet Take 1 tablet (20 mg total) by mouth at bedtime. 90 tablet 3   baclofen (LIORESAL) 10 MG tablet Take 1 tablet (10 mg total) by mouth 3 (three) times daily. 270 tablet 3    EPINEPHrine 0.3 mg/0.3 mL IJ SOAJ injection Inject 0.3 mg into the muscle as needed for anaphylaxis. 2 each 3   gabapentin (NEURONTIN) 300 MG capsule Take 1 capsule (300 mg total) by mouth at bedtime. (Patient taking differently: Take 300 mg by mouth at bedtime. PRN) 90 capsule 3   hydrochlorothiazide (HYDRODIURIL) 25 MG tablet TAKE 1 TABLET (25 MG TOTAL) BY MOUTH DAILY. 90 tablet 0   omalizumab (XOLAIR) 150 MG injection Inject 150 mg into the skin every 28 (twenty-eight) days.     tadalafil (CIALIS) 20 MG tablet Take 1 tablet (20 mg total) by mouth every other day as needed for erectile dysfunction. 90 tablet 5   cetirizine (ZYRTEC) 10 MG tablet Take 1 tablet (10 mg total) by mouth at bedtime. (Patient not taking: Reported on 08/12/2023) 30 tablet 11   meloxicam (MOBIC) 15 MG tablet TAKE 1 TABLET BY MOUTH EVERY DAY AS NEEDED FOR PAIN (Patient not taking: Reported on 08/12/2023) 90 tablet 1   No current facility-administered medications for this visit.    Allergies:   Almond (diagnostic), Nsaids, and Pork-derived products    Social History:  The patient  reports that he has been smoking cigarettes. He has a 7.5 pack-year smoking history. He has never used smokeless tobacco. He reports current alcohol use of about 5.0 - 6.0 standard drinks of alcohol per week. He reports that he does not use drugs.   Family History:  The patient's family history includes Diabetes in his cousin, maternal aunt, and mother; Healthy in his father and sister; Heart disease in his mother; Heart failure in his mother; Hypertension in his mother.    ROS:  Please see the history of present illness.   Otherwise, review of systems are positive for none.   All other systems are reviewed and negative.    PHYSICAL EXAM: VS:  BP 130/88   Pulse 79   Ht 5\' 10"  (1.778 m)   Wt 231 lb 12.8 oz (105.1 kg)   SpO2 98%   BMI 33.26 kg/m  , BMI Body mass index is 33.26 kg/m. GEN: Well nourished, well developed, in no acute  distress  HEENT: normal  Neck: no JVD, carotid bruits, or masses Cardiac: RRR; no murmurs, rubs, or gallops,no edema  Respiratory:  clear to auscultation bilaterally, normal work of breathing GI: soft, nontender, nondistended, + BS MS: no deformity or atrophy  Skin: warm and dry, no rash Neuro:  Strength and sensation are intact Psych: euthymic mood, full affect Distal pedal pulses are palpable   EKG:  EKG is ordered today. The ekg ordered today demonstrates : Normal sinus rhythm Normal ECG No previous ECGs available      Recent Labs: 01/13/2023: ALT 29; BUN 16; Creat 0.99; Hemoglobin 15.8; Platelets 244; Potassium 4.3; Sodium 138; TSH 3.77    Lipid Panel    Component Value Date/Time   CHOL 158 01/13/2023 0757   TRIG 253 (H) 01/13/2023 0757   HDL 35 (L) 01/13/2023 0757   CHOLHDL 4.5 01/13/2023 0757   LDLCALC 90 01/13/2023 0757      Wt Readings from Last 3 Encounters:  08/12/23 231 lb 12.8 oz (105.1 kg)  01/20/23 230 lb (104.3 kg)  08/19/22 234 lb (106.1 kg)         08/12/2023    9:50 AM  PAD Screen  Previous PAD dx? No  Previous surgical procedure? No  Pain with walking? No  Feet/toe relief with dangling? No  Painful, non-healing ulcers? No  Extremities discolored? No      ASSESSMENT AND PLAN:  1.  Significantly elevated coronary calcium score: He denies chest pain but does complain of exertional dyspnea.  I requested a treadmill stress test to ensure no obstructive disease.  I agree with low-dose aspirin and treatment of risk factors. I discussed with the patient the importance of lifestyle changes in order to decrease the chance of future coronary artery disease and cardiovascular events. We discussed the importance of controlling risk factors, healthy diet as well as regular exercise.  2.  Hyperlipidemia: I agree with atorvastatin.  I requested a follow-up lipid and liver profile.  3.  Tobacco use: I had a prolonged discussion with him about the  importance of smoking cessation and provided him with instructions to help him quit.     Disposition:   FU in 6 months.  Signed,  Lorine Bears, MD  08/12/2023 10:12 AM    [facility]

## 2023-08-12 NOTE — Patient Instructions (Signed)
 Medication Instructions:  No changes *If you need a refill on your cardiac medications before your next appointment, please call your pharmacy*   Lab Work: Your provider would like for you to have the following labs today: Lipid and Liver  If you have labs (blood work) drawn today and your tests are completely normal, you will receive your results only by: MyChart Message (if you have MyChart) OR A paper copy in the mail If you have any lab test that is abnormal or we need to change your treatment, we will call you to review the results.   Testing/Procedures: Your provider has ordered a exercise tolerance test. This test will evaluate the blood supply to your heart muscle during periods of exercise and rest. For this test, you will raise your heart rate by walking on a treadmill at different levels.   you may eat a light breakfast/ lunch prior to your procedure no caffeine for 24 hours prior to your test (coffee, tea, soft drinks, or chocolate)  no smoking/ vaping for 4 hours prior to your test you may take your regular medications the day of your test except for:   - hold Hydrochlorothiazide that morning bring any inhalers with you to your test wear comfortable clothing & tennis/ non-skid shoes to walk on the treadmill  This will take place at [address] [facility]  [place] [address]    Follow-Up: At [facility], you and your health needs are our priority.  As part of our continuing mission to provide you with exceptional heart care, we have created designated Provider Care Teams.  These Care Teams include your primary Cardiologist (physician) and Advanced Practice Providers (APPs -  Physician Assistants and Nurse Practitioners) who all work together to provide you with the care you need, when you need it.  We recommend signing up for the patient portal called "MyChart".  Sign up information is provided on this After Visit Summary.  MyChart is used to  connect with patients for Virtual Visits (Telemedicine).  Patients are able to view lab/test results, encounter notes, upcoming appointments, etc.  Non-urgent messages can be sent to your provider as well.   To learn more about what you can do with MyChart, go to [email or web address].    Your next appointment:   6 month(s)  Provider:   You may see Lorine Bears, MD or one of the following Advanced Practice Providers on your designated Care Team:   Nicolasa Ducking, NP Eula Listen, PA-C Cadence Fransico Michael, PA-C Charlsie Quest, NP Carlos Levering, NP    Other Instructions Managing the Challenge of Quitting Smoking Quitting smoking is a physical and mental challenge. You may have cravings, withdrawal symptoms, and temptation to smoke. Before quitting, work with your health care provider to make a plan that can help you manage quitting. Making a plan before you quit may keep you from smoking when you have the urge to smoke while trying to quit. How to manage lifestyle changes Managing stress Stress can make you want to smoke, and wanting to smoke may cause stress. It is important to find ways to manage your stress. You could try some of the following: Practice relaxation techniques. Breathe slowly and deeply, in through your nose and out through your mouth. Listen to music. Soak in a bath or take a shower. Imagine a peaceful place or vacation. Get some support. Talk with family or friends about your stress. Join a support group. Talk with a counselor or therapist. Get  some physical activity. Go for a walk, run, or bike ride. Play a favorite sport. Practice yoga.  Medicines Talk with your health care provider about medicines that might help you deal with cravings and make quitting easier for you. Relationships Social situations can be difficult when you are quitting smoking. To manage this, you can: Avoid parties and other social situations where people might be smoking. Avoid  alcohol. Leave right away if you have the urge to smoke. Explain to your family and friends that you are quitting smoking. Ask for support and let them know you might be a bit grumpy. Plan activities where smoking is not an option. General instructions Be aware that many people gain weight after they quit smoking. However, not everyone does. To keep from gaining weight, have a plan in place before you quit, and stick to the plan after you quit. Your plan should include: Eating healthy snacks. When you have a craving, it may help to: Eat popcorn, or try carrots, celery, or other cut vegetables. Chew sugar-free gum. Changing how you eat. Eat small portion sizes at meals. Eat 4-6 small meals throughout the day instead of 1-2 large meals a day. Be mindful when you eat. You should avoid watching television or doing other things that might distract you as you eat. Exercising regularly. Make time to exercise each day. If you do not have time for a long workout, do short bouts of exercise for 5-10 minutes several times a day. Do some form of strengthening exercise, such as weight lifting. Do some exercise that gets your heart beating and causes you to breathe deeply, such as walking fast, running, swimming, or biking. This is very important. Drinking plenty of water or other low-calorie or no-calorie drinks. Drink enough fluid to keep your urine pale yellow.  How to recognize withdrawal symptoms Your body and mind may experience discomfort as you try to get used to not having nicotine in your system. These effects are called withdrawal symptoms. They may include: Feeling hungrier than normal. Having trouble concentrating. Feeling irritable or restless. Having trouble sleeping. Feeling depressed. Craving a cigarette. These symptoms may surprise you, but they are normal to have when quitting smoking. To manage withdrawal symptoms: Avoid places, people, and activities that trigger your  cravings. Remember why you want to quit. Get plenty of sleep. Avoid coffee and other drinks that contain caffeine. These may worsen some of your symptoms. How to manage cravings Come up with a plan for how to deal with your cravings. The plan should include the following: A definition of the specific situation you want to deal with. An activity or action you will take to replace smoking. A clear idea for how this action will help. The name of someone who could help you with this. Cravings usually last for 5-10 minutes. Consider taking the following actions to help you with your plan to deal with cravings: Keep your mouth busy. Chew sugar-free gum. Suck on hard candies or a straw. Brush your teeth. Keep your hands and body busy. Change to a different activity right away. Squeeze or play with a ball. Do an activity or a hobby, such as making bead jewelry, practicing needlepoint, or working with wood. Mix up your normal routine. Take a short exercise break. Go for a quick walk, or run up and down stairs. Focus on doing something kind or helpful for someone else. Call a friend or family member to talk during a craving. Join a support group. Contact a  quitline. Where to find support To get help or find a support group: Call the National Cancer Institute's Smoking Quitline: 1-800-QUIT-NOW 570-794-8570) Text QUIT to SmokefreeTXT: 454098 Where to find more information Visit these websites to find more information on quitting smoking: U.S. Department of Health and Human Services: www.smokefree.gov American Lung Association: www.freedomfromsmoking.org Centers for Disease Control and Prevention (CDC): FootballExhibition.com.br American Heart Association: www.heart.org Contact a health care provider if: You want to change your plan for quitting. The medicines you are taking are not helping. Your eating feels out of control or you cannot sleep. You feel depressed or become very anxious. Summary Quitting  smoking is a physical and mental challenge. You will face cravings, withdrawal symptoms, and temptation to smoke again. Preparation can help you as you go through these challenges. Try different techniques to manage stress, handle social situations, and prevent weight gain. You can deal with cravings by keeping your mouth busy (such as by chewing gum), keeping your hands and body busy, calling family or friends, or contacting a quitline for people who want to quit smoking. You can deal with withdrawal symptoms by avoiding places where people smoke, getting plenty of rest, and avoiding drinks that contain caffeine. This information is not intended to replace advice given to you by your health care provider. Make sure you discuss any questions you have with your health care provider. Document Revised: 04/26/2021 Document Reviewed: 04/26/2021 Elsevier Patient Education  2024 Elsevier Inc.   Heart-Healthy Eating Plan Many factors influence your heart health, including eating and exercise habits. Heart health is also called coronary health. Coronary risk increases with abnormal blood fat (lipid) levels. A heart-healthy eating plan includes limiting unhealthy fats, increasing healthy fats, limiting salt (sodium) intake, and making other diet and lifestyle changes. What is my plan? Your health care provider may recommend that: You limit your fat intake to _________% or less of your total calories each day. You limit your saturated fat intake to _________% or less of your total calories each day. You limit the amount of cholesterol in your diet to less than _________ mg per day. You limit the amount of sodium in your diet to less than _________ mg per day. What are tips for following this plan? Cooking Cook foods using methods other than frying. Baking, boiling, grilling, and broiling are all good options. Other ways to reduce fat include: Removing the skin from poultry. Removing all visible fats from  meats. Steaming vegetables in water or broth. Meal planning  At meals, imagine dividing your plate into fourths: Fill one-half of your plate with vegetables and green salads. Fill one-fourth of your plate with whole grains. Fill one-fourth of your plate with lean protein foods. Eat 2-4 cups of vegetables per day. One cup of vegetables equals 1 cup (91 g) broccoli or cauliflower florets, 2 medium carrots, 1 large bell pepper, 1 large sweet potato, 1 large tomato, 1 medium white potato, 2 cups (150 g) raw leafy greens. Eat 1-2 cups of fruit per day. One cup of fruit equals 1 small apple, 1 large banana, 1 cup (237 g) mixed fruit, 1 large orange,  cup (82 g) dried fruit, 1 cup (240 mL) 100% fruit juice. Eat more foods that contain soluble fiber. Examples include apples, broccoli, carrots, beans, peas, and barley. Aim to get 25-30 g of fiber per day. Increase your consumption of legumes, nuts, and seeds to 4-5 servings per week. One serving of dried beans or legumes equals  cup (90 g) cooked, 1  serving of nuts is  oz (12 almonds, 24 pistachios, or 7 walnut halves), and 1 serving of seeds equals  oz (8 g). Fats Choose healthy fats more often. Choose monounsaturated and polyunsaturated fats, such as olive and canola oils, avocado oil, flaxseeds, walnuts, almonds, and seeds. Eat more omega-3 fats. Choose salmon, mackerel, sardines, tuna, flaxseed oil, and ground flaxseeds. Aim to eat fish at least 2 times each week. Check food labels carefully to identify foods with trans fats or high amounts of saturated fat. Limit saturated fats. These are found in animal products, such as meats, butter, and cream. Plant sources of saturated fats include palm oil, palm kernel oil, and coconut oil. Avoid foods with partially hydrogenated oils in them. These contain trans fats. Examples are stick margarine, some tub margarines, cookies, crackers, and other baked goods. Avoid fried foods. General information Eat  more home-cooked food and less restaurant, buffet, and fast food. Limit or avoid alcohol. Limit foods that are high in added sugar and simple starches such as foods made using white refined flour (white breads, pastries, sweets). Lose weight if you are overweight. Losing just 5-10% of your body weight can help your overall health and prevent diseases such as diabetes and heart disease. Monitor your sodium intake, especially if you have high blood pressure. Talk with your health care provider about your sodium intake. Try to incorporate more vegetarian meals weekly. What foods should I eat? Fruits All fresh, canned (in natural juice), or frozen fruits. Vegetables Fresh or frozen vegetables (raw, steamed, roasted, or grilled). Green salads. Grains Most grains. Choose whole wheat and whole grains most of the time. Rice and pasta, including brown rice and pastas made with whole wheat. Meats and other proteins Lean, well-trimmed beef, veal, pork, and lamb. Chicken and [place] without skin. All fish and shellfish. Wild duck, rabbit, pheasant, and venison. Egg whites or low-cholesterol egg substitutes. Dried beans, peas, lentils, and tofu. Seeds and most nuts. Dairy Low-fat or nonfat cheeses, including ricotta and mozzarella. Skim or 1% milk (liquid, powdered, or evaporated). Buttermilk made with low-fat milk. Nonfat or low-fat yogurt. Fats and oils Non-hydrogenated (trans-free) margarines. Vegetable oils, including soybean, sesame, sunflower, olive, avocado, peanut, safflower, corn, canola, and cottonseed. Salad dressings or mayonnaise made with a vegetable oil. Beverages Water (mineral or sparkling). Coffee and tea. Unsweetened ice tea. Diet beverages. Sweets and desserts Sherbet, gelatin, and fruit ice. Small amounts of dark chocolate. Limit all sweets and desserts. Seasonings and condiments All seasonings and condiments. The items listed above may not be a complete list of foods and beverages  you can eat. Contact a dietitian for more options. What foods should I avoid? Fruits Canned fruit in heavy syrup. Fruit in cream or butter sauce. Fried fruit. Limit coconut. Vegetables Vegetables cooked in cheese, cream, or butter sauce. Fried vegetables. Grains Breads made with saturated or trans fats, oils, or whole milk. Croissants. Sweet rolls. Donuts. High-fat crackers, such as cheese crackers and chips. Meats and other proteins Fatty meats, such as hot dogs, ribs, sausage, bacon, rib-eye roast or steak. High-fat deli meats, such as salami and bologna. Caviar. Domestic duck and goose. Organ meats, such as liver. Dairy Cream, sour cream, cream cheese, and creamed cottage cheese. Whole-milk cheeses. Whole or 2% milk (liquid, evaporated, or condensed). Whole buttermilk. Cream sauce or high-fat cheese sauce. Whole-milk yogurt. Fats and oils Meat fat, or shortening. Cocoa butter, hydrogenated oils, palm oil, coconut oil, palm kernel oil. Solid fats and shortenings, including bacon fat, salt pork,  lard, and butter. Nondairy cream substitutes. Salad dressings with cheese or sour cream. Beverages Regular sodas and any drinks with added sugar. Sweets and desserts Frosting. Pudding. Cookies. Cakes. Pies. Milk chocolate or white chocolate. Buttered syrups. Full-fat ice cream or ice cream drinks. The items listed above may not be a complete list of foods and beverages to avoid. Contact a dietitian for more information. Summary Heart-healthy meal planning includes limiting unhealthy fats, increasing healthy fats, limiting salt (sodium) intake and making other diet and lifestyle changes. Lose weight if you are overweight. Losing just 5-10% of your body weight can help your overall health and prevent diseases such as diabetes and heart disease. Focus on eating a balance of foods, including fruits and vegetables, low-fat or nonfat dairy, lean protein, nuts and legumes, whole grains, and heart-healthy oils  and fats. This information is not intended to replace advice given to you by your health care provider. Make sure you discuss any questions you have with your health care provider. Document Revised: 06/10/2021 Document Reviewed: 06/10/2021 Elsevier Patient Education  2024 [employer].

## 2023-08-13 LAB — LIPID PANEL
Chol/HDL Ratio: 3.1 ratio (ref 0.0–5.0)
Cholesterol, Total: 104 mg/dL (ref 100–199)
HDL: 34 mg/dL — ABNORMAL LOW (ref >39)
LDL Chol Calc (NIH): 41 mg/dL (ref 0–99)
Triglycerides: 172 mg/dL — ABNORMAL HIGH (ref 0–149)
VLDL Cholesterol Cal: 29 mg/dL (ref 5–40)

## 2023-08-13 LAB — HEPATIC FUNCTION PANEL
ALT: 30 IU/L (ref 0–44)
AST: 25 IU/L (ref 0–40)
Albumin: 4.3 g/dL (ref 3.8–4.9)
Alkaline Phosphatase: 90 IU/L (ref 44–121)
Bilirubin Total: 0.6 mg/dL (ref 0.0–1.2)
Bilirubin, Direct: 0.19 mg/dL (ref 0.00–0.40)
Total Protein: 8.1 g/dL (ref 6.0–8.5)

## 2023-08-24 ENCOUNTER — Ambulatory Visit
Admission: RE | Admit: 2023-08-24 | Discharge: 2023-08-24 | Disposition: A | Discharge status: Home / Self Care | Source: Ambulatory Visit | Attending: Cardiovascular Disease | Admitting: Cardiovascular Disease

## 2023-08-24 DIAGNOSIS — R0609 Other forms of dyspnea: Secondary | ICD-10-CM

## 2023-08-24 LAB — EXERCISE TOLERANCE TEST
Angina Index: 0
Duke Treadmill Score: 9
Estimated workload: 10.4
Exercise duration (min): 9 min
Exercise duration (sec): 13 s
MPHR: 169 {beats}/min
Peak HR: 166 {beats}/min
Percent HR: 98 %
Rest HR: 97 {beats}/min
ST Depression (mm): 0 mm

## 2023-08-31 ENCOUNTER — Encounter: Payer: Self-pay | Admitting: Family Medicine

## 2023-08-31 ENCOUNTER — Ambulatory Visit: Payer: BC Managed Care – PPO | Admitting: Family Medicine

## 2023-08-31 VITALS — BP 138/78 | HR 106 | Ht 70.0 in | Wt 230.4 lb

## 2023-08-31 DIAGNOSIS — R7303 Prediabetes: Secondary | ICD-10-CM

## 2023-08-31 DIAGNOSIS — G4719 Other hypersomnia: Secondary | ICD-10-CM

## 2023-08-31 DIAGNOSIS — R29818 Other symptoms and signs involving the nervous system: Secondary | ICD-10-CM | POA: Diagnosis not present

## 2023-08-31 DIAGNOSIS — I10 Essential (primary) hypertension: Secondary | ICD-10-CM

## 2023-08-31 DIAGNOSIS — N529 Male erectile dysfunction, unspecified: Secondary | ICD-10-CM | POA: Diagnosis not present

## 2023-08-31 LAB — POCT GLYCOSYLATED HEMOGLOBIN (HGB A1C): Hemoglobin A1C: 6 % — AB (ref 4.0–5.6)

## 2023-08-31 MED ORDER — TADALAFIL 20 MG PO TABS: 20.0000 mg | ORAL_TABLET | ORAL | 5 refills | Status: AC | PRN

## 2023-08-31 NOTE — Progress Notes (Unsigned)
 Subjective:    Patient ID: Aaron Woodard, male    DOB: [birth year], 53 y.o.   MRN: [id number]  Aaron Woodard is a 53 y.o. male presenting on 08/31/2023 for No chief complaint on file.   HPI    CHRONIC HTN: Home BP readings, previously controlled. None recently. Current Meds - HCTZ 25mg  daily No side effect or issue Denies CP, dyspnea, HA, edema, dizziness / lightheadedness  ***   Elevated A1c / Pre Diabetes A1c down from 6.3 down to 6.0 with improved lifestyle, he has reduced sodas and alcohol significantly. Meds: none Lifestyle: - Diet (goal to limit carb starches, also drinking more alcohol lately)  Denies hypoglycemia, polyuria, visual changes, numbness or tingling.  Hypertriglyceridemia Last lab shows TG 253, and LDL 90 LDL is at goal Not on statin therapy  Tobacco Abuse Average 10-12 cig per day ***  ***Cardiology  Erectile Dysfunction Failed Sildenafil Improved on Tadalafil 20mg  but seems unpredictable at times, may not be as effective. Contact if want to refer to Urologist.  ***Poor sleep at night   Epworth Sleepiness Scale Total Score: *** Sitting and reading - *** Watching TV - *** Sitting inactive in a public place - *** As a passenger in a car for an hour without a break - *** Lying down to rest in the afternoon when circumstances permit - *** Sitting and talking to someone - *** Sitting quietly after a lunch without alcohol - *** In a car, while stopped for a few minutes in traffic - ***  Chance of dozing off under normal circumstances 0 = Never 1 = Slight chance 2 = Moderate chance 3 = High chance  0-7: It is unlikely that you are abnormally sleepy. 8-9: You have an average amount of daytime sleepiness. >9: POSITIVE - Recommend further evaluation, sleep specialist or sleep study (>16-24 = severe)  STOP-Bang OSA scoring Snoring yes   Tiredness yes   Observed apneas yes   Pressure HTN no   BMI > 35 kg/m2 no   Age > 50  no   Neck  (male >17 in; Male >16 in)  no   Gender male no   OSA risk low (0-2)  OSA risk intermediate (3-4)  OSA risk high (5+)  Total:        Health Maintenance: ***     08/31/2023   12:57 PM 01/20/2023    3:23 PM 08/19/2022    8:26 AM  Depression screen PHQ 2/9  Decreased Interest 0 0 0  Down, Depressed, Hopeless 0 0 0  PHQ - 2 Score 0 0 0  Altered sleeping   1  Tired, decreased energy 1  1  Change in appetite 1  0  Feeling bad or failure about yourself  0  0  Trouble concentrating 0  0  Moving slowly or fidgety/restless 0  0  Suicidal thoughts 0  0  PHQ-9 Score   2  Difficult doing work/chores Not difficult at all  Not difficult at all       08/31/2023   12:57 PM 01/20/2023    3:24 PM 08/19/2022    8:26 AM 03/31/2022    3:03 PM  GAD 7 : Generalized Anxiety Score  Nervous, Anxious, on Edge 0 0 0 0  Control/stop worrying 0 0 0 0  Worry too much - different things 1 0 0 0  Trouble relaxing 0 0 0 0  Restless 0 0 0 0  Easily annoyed or irritable 1 0 0 0  Afraid - awful might happen 0 0 0 0  Total GAD 7 Score 2 0 0 0  Anxiety Difficulty Not difficult at all  Not difficult at all Not difficult at all    Social History   Tobacco Use   Smoking status: Every Day    Current packs/day: 0.50    Average packs/day: 0.5 packs/day for 15.0 years (7.5 ttl pk-yrs)    Types: Cigarettes   Smokeless tobacco: Never  Vaping Use   Vaping status: Never Used  Substance Use Topics   Alcohol use: Yes    Alcohol/week: 5.0 - 6.0 standard drinks of alcohol    Types: 5 - 6 Standard drinks or equivalent per week    Comment: 2-3 Beers Daily   Drug use: No    Review of Systems Per HPI unless specifically indicated above     Objective:    BP 138/78 (BP Location: Left Arm, Cuff Size: Normal)   Pulse (!) 106   Ht 5\' 10"  (1.778 m)   Wt 230 lb 6 oz (104.5 kg)   SpO2 96%   BMI 33.06 kg/m   Wt Readings from Last 3 Encounters:  08/31/23 230 lb 6 oz (104.5 kg)  08/12/23 231 lb 12.8 oz (105.1  kg)  01/20/23 230 lb (104.3 kg)    Physical Exam   Exercise Tolerance Test [[id number]] Resulted: 08/24/23 1111  Order Status: Completed Updated: 08/24/23 1111   Angina Index 0   Duke Treadmill Score 9   Rest HR 97.0 bpm    Rest BP 133/78 mmHg    Exercise duration (min) 9 min    Exercise duration (sec) 13 sec    Estimated workload 10.4   Peak HR 166 bpm    Peak BP 209/68 mmHg    MPHR 169 bpm    Percent HR 98.0 %    ST Depression (mm) 0 mm   Narrative:      Exercise capacity was mildly impaired. Patient exercised for 9 min and 13 sec. Maximum HR of 166 bpm. MPHR 98.0%. Peak METS 10.4. The patient experienced no angina during the test. Hypertensive blood pressure response noted during stress.   No ST deviation was noted. There were no arrhythmias during stress. The ECG was negative for ischemia.    Results for orders placed or performed in visit on 08/31/23  POCT HgB A1C   Collection Time: 08/31/23 11:11 AM  Result Value Ref Range   Hemoglobin A1C 6.0 (A) 4.0 - 5.6 %   HbA1c POC (<> result, manual entry)     HbA1c, POC (prediabetic range)     HbA1c, POC (controlled diabetic range)        Assessment & Plan:   Problem List Items Addressed This Visit     Essential hypertension   Relevant Medications   tadalafil (CIALIS) 20 MG tablet   Pre-diabetes - Primary   Relevant Orders   POCT HgB A1C (Completed)   Other Visit Diagnoses       Erectile dysfunction, unspecified erectile dysfunction type       Relevant Medications   tadalafil (CIALIS) 20 MG tablet   Other Relevant Orders   Ambulatory referral to Urology     Suspected sleep apnea         Excessive daytime sleepiness              Orders Placed This Encounter  Procedures   Ambulatory referral to Urology    Referral Priority:   Routine    Referral  Type:   Consultation    Referral Reason:   Specialty Services Required    Requested Specialty:   Urology    Number of Visits Requested:   1   POCT HgB A1C     Meds ordered this encounter  Medications   tadalafil (CIALIS) 20 MG tablet    Sig: Take 1 tablet (20 mg total) by mouth every other day as needed for erectile dysfunction.    Dispense:  30 tablet    Refill:  5    Follow up plan: Return if symptoms worsen or fail to improve, for 4 month fasting lab > 1 week later Annual Physical.  Future labs ordered for ***   Domingo Friend, DO [facility] 08/31/2023, 11:25 AM

## 2023-08-31 NOTE — Patient Instructions (Addendum)
 Thank you for coming to the office today.  Recommend sleep study next to evaluate for sleep apnea.  Check with insurance if you need a Sleep Lab / Sleep Center sleep study  [facility] Address: [address] [place], [place], [place] [address] Phone: [phone]  ------------  OR if insurance or you prefer a IN HOME ONLY Sleep Study - HST we can use [facility]  [email or web address]  Message or call once you figure out which location or type of sleep study you prefer / cost   ------------------------  [facility] -1st floor [address] [place],  [place]  [address] Phone: [phone]   [place] Quitline 1 800-QUIT NOW  They can help with behavioral therapy and also provide free patches / gum etc I believe if you setup with them  Nicotine Patches Dosing: Brand Dosage Duration  Habitrol / "Generic nicotine patches" (21,14, or 7mg /day over 24 hours) 21mg / 24 hours 14mg /24 hours 7mg /24 hours 4 weeks then 2 weeks then 2 weeks  Nicoderm CQ  (21, 14, or 7mg /day over 24 hours) Original and Clear 21mg /24 hours 14mg /24 hours 7mg /24 hours 6 weeks then 2 weeks then 2 weeks  *Consider a small initial dosage for light smokers (10 cigarettes daily or less)  Prescribing Instructions: No smoking while using the patch Do not remove the patch from its sealed protective pouch until you are ready to apply.  Place the patch firmly on your skin in a relatively hairless location between the neck and waist. The "old" patch should be removed daily and the "new" patch should be applied in a different location.  Do not return to a previous site for at least 1 week. Discard the used patch in a way that prevents children or pets from possible exposure. Wash your hands with water when you have finished applying. The nicotine patch will cause a local skin reaction in up to 50% of patients.  Skin reactions are  usually mild and are self-limiting, but may worsen over the course of therapy.  Rotating patch sites and local treatment with a topical steroid cream and may improve these reactions. If patient experiences sleep disturbances, remove patch at bedtime     Nicotine Gum Dosing: Brand  Dosage Duration  Nicorette (4mg ) / generic nicotine gum  OTC For highly dependent patients with severe withdrawal symptoms, or who have failed on 2mg .  Not to exceed 20 pieces daily  2-3 months then gradually start weaning  Nicorette (2mg ) / generic nicotine gum OTC For patients who smoke <25 cigarettes per day Not to exceed 30 pieces daily 1-3 months   Prescribing Instructions: No smoking while using the gum. Take one piece of gum on a fixed schedule (at least one piece every 1-2 hours). Gum should be chewed slowly until a "peppery" taste emerges, (about 15 chews)  "Park the gum". Place it between the cheek and gum to facilitate nicotine absorption through the oral mucosa.   Start to chew slowly again when the taste or tingling is almost gone (about 1 minute) Gum should be slowly and intermittently "chewed and parked". Flavors include: [employer], Cinnamon Surge, [employer], [place], [employer] and Original Absorption: Acidic beverages (i.e. coffee, juices, soft drinks) interfere with the buccal absorption of nicotine and should be avoided for 15 minutes before and during chewing. Common side effects of nicotine chewing gum include mouth soreness, hiccups, dyspepsia, and jaw ache.  These effects are generally mild and transient, and  can often be alleviated by correcting the patients' chewing technique.     ---------------- Referral to Urology for ED  [facility] -1st floor [address] [place],  [place]  [address] Phone: [phone]  DUE for FASTING BLOOD WORK (no food or drink after midnight before the lab appointment, only water or coffee without  cream/sugar on the morning of)  SCHEDULE "Lab Only" visit in the morning at the clinic for lab draw in 4 MONTHS   - Make sure Lab Only appointment is at about 1 week before your next appointment, so that results will be available  For Lab Results, once available within 2-3 days of blood draw, you can can log in to MyChart online to view your results and a brief explanation. Also, we can discuss results at next follow-up visit.   Please schedule a Follow-up Appointment to: Return if symptoms worsen or fail to improve, for 4 month fasting lab > 1 week later Annual Physical.  If you have any other questions or concerns, please feel free to call the office or send a message through MyChart. You may also schedule an earlier appointment if necessary.  Additionally, you may be receiving a survey about your experience at our office within a few days to 1 week by e-mail or mail. We value your feedback.  Domingo Friend, DO [facility], [place]

## 2023-09-01 ENCOUNTER — Other Ambulatory Visit: Payer: Self-pay | Admitting: Family Medicine

## 2023-09-01 DIAGNOSIS — I10 Essential (primary) hypertension: Secondary | ICD-10-CM

## 2023-09-01 DIAGNOSIS — R7303 Prediabetes: Secondary | ICD-10-CM

## 2023-09-01 DIAGNOSIS — Z0000 Encounter for general adult medical examination without abnormal findings: Secondary | ICD-10-CM

## 2023-09-01 DIAGNOSIS — E785 Hyperlipidemia, unspecified: Secondary | ICD-10-CM

## 2023-09-01 DIAGNOSIS — Z125 Encounter for screening for malignant neoplasm of prostate: Secondary | ICD-10-CM

## 2023-09-22 ENCOUNTER — Encounter: Payer: Self-pay | Admitting: Urology

## 2023-09-22 ENCOUNTER — Ambulatory Visit: Admitting: Urology

## 2023-09-22 VITALS — BP 160/96 | HR 83 | Ht 70.0 in | Wt 230.0 lb

## 2023-09-22 DIAGNOSIS — Z125 Encounter for screening for malignant neoplasm of prostate: Secondary | ICD-10-CM

## 2023-09-22 DIAGNOSIS — N529 Male erectile dysfunction, unspecified: Secondary | ICD-10-CM | POA: Diagnosis not present

## 2023-09-22 NOTE — Patient Instructions (Signed)

## 2023-09-22 NOTE — Progress Notes (Signed)
   09/22/23 3:50 PM   Michale Age Leeta Puls 1970-12-31 [id number]  CC: ED, PSA screening  HPI: 53 year old male with diabetes, hypertension who presents with ED.  He has been managed by his PCP long-term with both sildenafil  and tadalafil .  He started the tadalafil  max dose 20 mg on demand about 6 months ago which originally was helpful, but has decreased in effectiveness over the last few months.  Testosterone  was last checked in March 2024 was normal at 374.  He reports some increased fatigue and decreased libido over the last year.  He is a smoker.  Sleep apnea evaluation pending  PSA normal at 0.2 from August 2024.   PMH: Past Medical History:  Diagnosis Date   Acute appendicitis with localized peritonitis    Allergy    Appendicitis, acute 10/04/2016    Surgical History: Past Surgical History:  Procedure Laterality Date   APPENDECTOMY     COLONOSCOPY WITH PROPOFOL  N/A 06/18/2022   Procedure: COLONOSCOPY WITH PROPOFOL ;  Surgeon: Luke Salaam, MD;  Location: [facility] ENDOSCOPY;  Service: Gastroenterology;  Laterality: N/A;   LAPAROSCOPIC APPENDECTOMY N/A 10/04/2016   Procedure: APPENDECTOMY LAPAROSCOPIC;  Surgeon: Claudia Cuff, MD;  Location: [facility] ORS;  Service: General;  Laterality: N/A;     Family History: Family History  Problem Relation Age of Onset   Diabetes Mother    Hypertension Mother    Heart disease Mother    Heart failure Mother    Healthy Father    Healthy Sister    Diabetes Maternal Aunt    Diabetes Cousin     Social History:  reports that he has been smoking cigarettes. He has a 7.5 pack-year smoking history. He has never used smokeless tobacco. He reports current alcohol use of about 5.0 - 6.0 standard drinks of alcohol per week. He reports that he does not use drugs.  Physical Exam: BP (!) 160/96   Pulse 83   Ht 5\' 10"  (1.778 m)   Wt 230 lb (104.3 kg)   BMI 33.00 kg/m    Constitutional:  Alert and oriented, No acute distress. Cardiovascular: No  clubbing, cyanosis, or edema. Respiratory: Normal respiratory effort, no increased work of breathing. GI: Abdomen is soft, nontender, nondistended, no abdominal masses   Laboratory Data: Reviewed, see HPI  Assessment & Plan:   53 year old male with ED now refractory to sildenafil  and tadalafil  max dose.  We reviewed the AUA guidelines regarding ED and I recommended starting with a repeat morning testosterone .  Could also consider trialing a different blood pressure medication besides hydrochlorothiazide .  We discussed other treatment options for ED including penile injections or penile prosthesis.  Reassurance provided regarding normal PSA.  Agree with sleep apnea evaluation Check morning testosterone , if low can start testosterone  and continue Cialis  Consider Trimix in the future Consider referral to Dr. Jarvis Mesa to consider penile prosthesis   Jay Meth, MD 09/22/2023  [facility] [address] [place], [place] [address] [phone]

## 2023-09-23 ENCOUNTER — Other Ambulatory Visit: Payer: Self-pay | Admitting: Family Medicine

## 2023-09-23 DIAGNOSIS — G8929 Other chronic pain: Secondary | ICD-10-CM

## 2023-09-23 DIAGNOSIS — M150 Primary generalized (osteo)arthritis: Secondary | ICD-10-CM

## 2023-09-23 DIAGNOSIS — M545 Low back pain, unspecified: Secondary | ICD-10-CM

## 2023-09-23 DIAGNOSIS — I10 Essential (primary) hypertension: Secondary | ICD-10-CM

## 2023-09-25 NOTE — Telephone Encounter (Signed)
 Requested Prescriptions  Pending Prescriptions Disp Refills   hydrochlorothiazide  (HYDRODIURIL ) 25 MG tablet [Pharmacy Med Name: HYDROCHLOROTHIAZIDE  25 MG TAB] 90 tablet 0    Sig: TAKE 1 TABLET (25 MG TOTAL) BY MOUTH DAILY.     Cardiovascular: Diuretics - Thiazide Failed - 09/25/2023  8:34 AM      Failed - Cr in normal range and within 180 days    Creat  Date Value Ref Range Status  01/13/2023 0.99 0.70 - 1.30 mg/dL Final         Failed - K in normal range and within 180 days    Potassium  Date Value Ref Range Status  01/13/2023 4.3 3.5 - 5.3 mmol/L Final         Failed - Na in normal range and within 180 days    Sodium  Date Value Ref Range Status  01/13/2023 138 135 - 146 mmol/L Final         Failed - Last BP in normal range    BP Readings from Last 1 Encounters:  09/22/23 (!) 160/96         Passed - Valid encounter within last 6 months    Recent Outpatient Visits           3 weeks ago Pre-diabetes   [place] [facility] [place], Kayleen Party, DO               baclofen  (LIORESAL ) 10 MG tablet [Pharmacy Med Name: BACLOFEN  10 MG TABLET] 270 tablet 0    Sig: TAKE 1 TABLET BY MOUTH THREE TIMES A DAY     Analgesics:  Muscle Relaxants - baclofen  Failed - 09/25/2023  8:34 AM      Failed - Cr in normal range and within 180 days    Creat  Date Value Ref Range Status  01/13/2023 0.99 0.70 - 1.30 mg/dL Final         Failed - eGFR is 30 or above and within 180 days    GFR calc Af Amer  Date Value Ref Range Status  12/21/2019 >60 >60 mL/min Final   GFR, Estimated  Date Value Ref Range Status  08/17/2020 >60 >60 mL/min Final    Comment:    (NOTE) Calculated using the CKD-EPI Creatinine Equation (2021)    eGFR  Date Value Ref Range Status  01/13/2023 93 > OR = 60 mL/min/1.29m2 Final         Passed - Valid encounter within last 6 months    Recent Outpatient Visits           3 weeks ago Pre-diabetes    [facility] [place], Kayleen Party, DO

## 2023-09-28 ENCOUNTER — Other Ambulatory Visit
Admission: RE | Admit: 2023-09-28 | Discharge: 2023-09-28 | Disposition: A | Discharge status: Home / Self Care | Attending: Urology | Admitting: Urology

## 2023-09-28 DIAGNOSIS — N529 Male erectile dysfunction, unspecified: Secondary | ICD-10-CM | POA: Diagnosis not present

## 2023-09-30 ENCOUNTER — Ambulatory Visit: Payer: Self-pay | Admitting: Urology

## 2023-09-30 LAB — TESTOSTERONE: Testosterone: 347 ng/dL (ref 264–916)

## 2023-10-07 ENCOUNTER — Encounter: Payer: Self-pay | Admitting: Family Medicine

## 2023-10-07 DIAGNOSIS — G4719 Other hypersomnia: Secondary | ICD-10-CM

## 2023-10-07 DIAGNOSIS — R29818 Other symptoms and signs involving the nervous system: Secondary | ICD-10-CM

## 2023-10-07 DIAGNOSIS — I10 Essential (primary) hypertension: Secondary | ICD-10-CM

## 2023-10-14 NOTE — Addendum Note (Signed)
 Addended by: Raina Bunting on: 10/14/2023 07:27 PM   Modules accepted: Orders

## 2023-10-21 MED ORDER — VALSARTAN 160 MG PO TABS: 160.0000 mg | ORAL_TABLET | Freq: Every day | ORAL | 3 refills | Status: DC

## 2023-10-21 NOTE — Addendum Note (Signed)
 Addended by: Raina Bunting on: 10/21/2023 07:36 PM   Modules accepted: Orders

## 2023-10-22 DIAGNOSIS — G4730 Sleep apnea, unspecified: Secondary | ICD-10-CM | POA: Diagnosis not present

## 2023-10-28 ENCOUNTER — Encounter: Payer: Self-pay | Admitting: Family Medicine

## 2023-10-28 DIAGNOSIS — M150 Primary generalized (osteo)arthritis: Secondary | ICD-10-CM

## 2023-10-28 DIAGNOSIS — G8929 Other chronic pain: Secondary | ICD-10-CM

## 2023-10-28 MED ORDER — MELOXICAM 15 MG PO TABS: 15.0000 mg | ORAL_TABLET | Freq: Every day | ORAL | 1 refills | Status: AC | PRN

## 2023-11-05 ENCOUNTER — Encounter: Payer: Self-pay | Admitting: Family Medicine

## 2023-11-05 DIAGNOSIS — G4733 Obstructive sleep apnea (adult) (pediatric): Secondary | ICD-10-CM | POA: Insufficient documentation

## 2023-11-11 ENCOUNTER — Telehealth: Payer: Self-pay

## 2023-11-11 NOTE — Telephone Encounter (Signed)
 Copied from CRM [id number]. Topic: Clinical - Home Health Verbal Orders >> Nov 11, 2023  1:07 PM Winona SAUNDERS wrote: Clotilda from [employer] calling with concerns about pt CPAP. Please have Rosina the manager call Clotilda at  [phone] when she has a moment .

## 2023-11-12 NOTE — Telephone Encounter (Signed)
 Spoke with patient, he has an appt on Tuesday for his CPAp, he also has a physical scheduled on 8/18.

## 2023-11-12 NOTE — Telephone Encounter (Signed)
 I don't know the answer to their question. They would have to call the patient.  I already received his [facility] sleep study and it was already scanned into the chart. I communicated with him on MyChart on 11/05/23. We reviewed his results, and faxed orders to [facility] at that time to setup CPAP.  I advised him to schedule to follow up with me on CPAP adherence visit 30-90 days after he receives his CPAP equipment.  ---------------------  Can you check with patient to see if he received CPAP from [facility] or if he needs something else? I am not quite sure what issue [facility] is calling about.  Thank you  Marsa Officer, DO [facility] 11/12/2023, 9:04 AM

## 2023-11-17 DIAGNOSIS — G4733 Obstructive sleep apnea (adult) (pediatric): Secondary | ICD-10-CM | POA: Diagnosis not present

## 2023-12-18 DIAGNOSIS — G4733 Obstructive sleep apnea (adult) (pediatric): Secondary | ICD-10-CM | POA: Diagnosis not present

## 2023-12-23 ENCOUNTER — Other Ambulatory Visit: Payer: Self-pay | Admitting: Family Medicine

## 2023-12-23 DIAGNOSIS — M150 Primary generalized (osteo)arthritis: Secondary | ICD-10-CM

## 2023-12-23 DIAGNOSIS — G8929 Other chronic pain: Secondary | ICD-10-CM

## 2023-12-23 DIAGNOSIS — M545 Low back pain, unspecified: Secondary | ICD-10-CM

## 2023-12-24 NOTE — Telephone Encounter (Signed)
 Requested medication (s) are due for refill today: yes  Requested medication (s) are on the active medication list: yes  Last refill:  09/25/23 #270  Future visit scheduled: yes  Notes to clinic:  Cr overdue    Requested Prescriptions  Pending Prescriptions Disp Refills   baclofen  (LIORESAL ) 10 MG tablet [Pharmacy Med Name: BACLOFEN  10 MG TABLET] 270 tablet 0    Sig: TAKE 1 TABLET BY MOUTH THREE TIMES A DAY     Analgesics:  Muscle Relaxants - baclofen  Failed - 12/24/2023  3:25 PM      Failed - Cr in normal range and within 180 days    Creat  Date Value Ref Range Status  01/13/2023 0.99 0.70 - 1.30 mg/dL Final         Failed - eGFR is 30 or above and within 180 days    GFR calc Af Amer  Date Value Ref Range Status  12/21/2019 >60 >60 mL/min Final   GFR, Estimated  Date Value Ref Range Status  08/17/2020 >60 >60 mL/min Final    Comment:    (NOTE) Calculated using the CKD-EPI Creatinine Equation (2021)    eGFR  Date Value Ref Range Status  01/13/2023 93 > OR = 60 mL/min/1.57m2 Final         Passed - Valid encounter within last 6 months    Recent Outpatient Visits           3 months ago Pre-diabetes   [facility] [place], Marsa PARAS, DO

## 2023-12-28 ENCOUNTER — Other Ambulatory Visit

## 2023-12-28 DIAGNOSIS — E785 Hyperlipidemia, unspecified: Secondary | ICD-10-CM | POA: Diagnosis not present

## 2023-12-28 DIAGNOSIS — R7303 Prediabetes: Secondary | ICD-10-CM

## 2023-12-28 DIAGNOSIS — I10 Essential (primary) hypertension: Secondary | ICD-10-CM

## 2023-12-28 DIAGNOSIS — Z125 Encounter for screening for malignant neoplasm of prostate: Secondary | ICD-10-CM | POA: Diagnosis not present

## 2023-12-28 DIAGNOSIS — Z0000 Encounter for general adult medical examination without abnormal findings: Secondary | ICD-10-CM

## 2023-12-28 LAB — LIPID PANEL
Cholesterol: 89 mg/dL (ref <200)
HDL: 36 mg/dL — ABNORMAL LOW (ref >=40)
LDL Cholesterol (Calc): 30 mg/dL
Non-HDL Cholesterol (Calc): 53 mg/dL (ref <130)
Total CHOL/HDL Ratio: 2.5 (calc) (ref <5.0)
Triglycerides: 142 mg/dL (ref <150)

## 2023-12-28 LAB — CBC WITH DIFFERENTIAL/PLATELET
Absolute Lymphocytes: 4323 {cells}/uL — ABNORMAL HIGH (ref 850–3900)
Absolute Monocytes: 891 {cells}/uL (ref 200–950)
Basophils Absolute: 44 {cells}/uL (ref 0–200)
Basophils Relative: 0.4 %
Eosinophils Absolute: 198 {cells}/uL (ref 15–500)
Eosinophils Relative: 1.8 %
HCT: 40.6 % (ref 38.5–50.0)
Hemoglobin: 13.1 g/dL — ABNORMAL LOW (ref 13.2–17.1)
MCH: 26.4 pg — ABNORMAL LOW (ref 27.0–33.0)
MCHC: 32.3 g/dL (ref 32.0–36.0)
MCV: 81.9 fL (ref 80.0–100.0)
MPV: 10.4 fL (ref 7.5–12.5)
Monocytes Relative: 8.1 %
Neutro Abs: 5544 {cells}/uL (ref 1500–7800)
Neutrophils Relative %: 50.4 %
Platelets: 244 Thousand/uL (ref 140–400)
RBC: 4.96 Million/uL (ref 4.20–5.80)
RDW: 16.1 % — ABNORMAL HIGH (ref 11.0–15.0)
Total Lymphocyte: 39.3 %
WBC: 11 Thousand/uL — ABNORMAL HIGH (ref 3.8–10.8)

## 2023-12-28 LAB — HEMOGLOBIN A1C
Hgb A1c MFr Bld: 5.8 % — ABNORMAL HIGH (ref <5.7)
Mean Plasma Glucose: 120 mg/dL
eAG (mmol/L): 6.6 mmol/L

## 2023-12-28 LAB — COMPREHENSIVE METABOLIC PANEL WITH GFR
AG Ratio: 1.2 (calc) (ref 1.0–2.5)
ALT: 18 U/L (ref 9–46)
AST: 22 U/L (ref 10–35)
Albumin: 4.1 g/dL (ref 3.6–5.1)
Alkaline phosphatase (APISO): 70 U/L (ref 35–144)
BUN: 14 mg/dL (ref 7–25)
CO2: 26 mmol/L (ref 20–32)
Calcium: 8.4 mg/dL — ABNORMAL LOW (ref 8.6–10.3)
Chloride: 105 mmol/L (ref 98–110)
Creat: 0.91 mg/dL (ref 0.70–1.30)
Globulin: 3.5 g/dL (ref 1.9–3.7)
Glucose, Bld: 90 mg/dL (ref 65–99)
Potassium: 4.1 mmol/L (ref 3.5–5.3)
Sodium: 139 mmol/L (ref 135–146)
Total Bilirubin: 0.4 mg/dL (ref 0.2–1.2)
Total Protein: 7.6 g/dL (ref 6.1–8.1)
eGFR: 102 mL/min/1.73m2 (ref >=60)

## 2023-12-28 LAB — PSA: PSA: 0.25 ng/mL (ref <=4.00)

## 2024-01-04 ENCOUNTER — Ambulatory Visit (INDEPENDENT_AMBULATORY_CARE_PROVIDER_SITE_OTHER): Admitting: Family Medicine

## 2024-01-04 ENCOUNTER — Encounter: Payer: Self-pay | Admitting: Family Medicine

## 2024-01-04 VITALS — BP 140/78 | HR 65 | Ht 70.0 in | Wt 233.4 lb

## 2024-01-04 DIAGNOSIS — R7303 Prediabetes: Secondary | ICD-10-CM | POA: Diagnosis not present

## 2024-01-04 DIAGNOSIS — I10 Essential (primary) hypertension: Secondary | ICD-10-CM | POA: Diagnosis not present

## 2024-01-04 DIAGNOSIS — Z0000 Encounter for general adult medical examination without abnormal findings: Secondary | ICD-10-CM | POA: Diagnosis not present

## 2024-01-04 DIAGNOSIS — G4733 Obstructive sleep apnea (adult) (pediatric): Secondary | ICD-10-CM

## 2024-01-04 NOTE — Progress Notes (Signed)
 Subjective:    Patient ID: Aaron Woodard, male    DOB: 1971-05-01, 53 y.o.   MRN: [id number]  Aaron Woodard is a 53 y.o. male presenting on 01/04/2024 for Medical Management of Chronic Issues, Annual Exam, and Hypertension   HPI  Discussed the use of AI scribe software for clinical note transcription with the patient, who gave verbal consent to proceed.  History of Present Illness   Aaron Woodard is a 53 year old male who presents for an annual physical exam.  Preventive health maintenance - Up to date with annual physical exams, last completed in 2024 - Receives annual influenza vaccination at work - Considering pneumococcal vaccination at a pharmacy due to insurance coverage concerns; aware of recommendation for adults 50 and older - Discussed shingles vaccination, aware of two-dose schedule spaced two to six months apart  Hyperlipidemia and laboratory findings - Currently taking cholesterol-lowering medication - Recent blood work (December 28, 2023): LDL cholesterol 30 mg/dL - J8R improved to 4.1% (previously 6.0% and 6.3%) - CBC: hemoglobin 13.1 g/dL, white blood cell count 11 x10^3/uL - PSA 0.25 ng/mL - Chemistry panel normal except for minor calcium  variation  Hypertension - Elevated blood pressure reading in clinic today - Home blood pressure readings typically 130-135/80-90 mmHg - Attributes elevated blood pressure to stress related to caregiving for father with dementia Home BP readings, previously controlled. None recently. Current Meds - Valsartan  160mg  daily Off thaizide  No side effect or issue Denies CP, dyspnea, HA, edema, dizziness / lightheadedness     Elevated A1c / Pre Diabetes - A1C improved to 5.8% (previously 6.0% and 6.3%) Meds: none Lifestyle: - Diet (goal to limit carb starches, also drinking more alcohol lately)  Denies hypoglycemia, polyuria, visual changes, numbness or tingling.    Tobacco Abuse Average 10-12 cig per day   Erectile  Dysfunction Failed Sildenafil  Tadalafil  Intersted in Urologist next   OSA on CPAP  OSA, on CPAP New diagnosis OSA, with recent sleep study - Using CPAP machine provided by [facility] since late June - Current CPAP usage rate 56.7% over past 30 days - Missed some days of CPAP use due to personal circumstances; working to improve usage to [employer] requirements - Experiencing less daytime sleepiness since starting CPAP, but some fatigue persists  - Today reports that sleep apnea is improved control. Tolerates the machine well, and thinks that sleeps better with it and feels good. No new concerns or symptoms.    Health Maintenance:  PSA 0.25 negative  Colonoscopy last done 06/18/22 [facility] Dr Therisa, repeat 10 year 2034     08/31/2023   12:57 PM 01/20/2023    3:23 PM 08/19/2022    8:26 AM  Depression screen PHQ 2/9  Decreased Interest 0 0 0  Down, Depressed, Hopeless 0 0 0  PHQ - 2 Score 0 0 0  Altered sleeping   1  Tired, decreased energy 1  1  Change in appetite 1  0  Feeling bad or failure about yourself  0  0  Trouble concentrating 0  0  Moving slowly or fidgety/restless 0  0  Suicidal thoughts 0  0  PHQ-9 Score   2  Difficult doing work/chores Not difficult at all  Not difficult at all       08/31/2023   12:57 PM 01/20/2023    3:24 PM 08/19/2022    8:26 AM 03/31/2022    3:03 PM  GAD 7 : Generalized Anxiety Score  Nervous, Anxious, on Edge 0  0 0 0  Control/stop worrying 0 0 0 0  Worry too much - different things 1 0 0 0  Trouble relaxing 0 0 0 0  Restless 0 0 0 0  Easily annoyed or irritable 1 0 0 0  Afraid - awful might happen 0 0 0 0  Total GAD 7 Score 2 0 0 0  Anxiety Difficulty Not difficult at all  Not difficult at all Not difficult at all     Past Medical History:  Diagnosis Date   Acute appendicitis with localized peritonitis    Allergy    Appendicitis, acute 10/04/2016   Past Surgical History:  Procedure Laterality Date   APPENDECTOMY     COLONOSCOPY  WITH PROPOFOL  N/A 06/18/2022   Procedure: COLONOSCOPY WITH PROPOFOL ;  Surgeon: Therisa Bi, MD;  Location: [facility] ENDOSCOPY;  Service: Gastroenterology;  Laterality: N/A;   LAPAROSCOPIC APPENDECTOMY N/A 10/04/2016   Procedure: APPENDECTOMY LAPAROSCOPIC;  Surgeon: Wonda Charlie BRAVO, MD;  Location: [facility] ORS;  Service: General;  Laterality: N/A;   Social History   Socioeconomic History   Marital status: Married    Spouse name: Not on file   Number of children: Not on file   Years of education: Not on file   Highest education level: Some college, no degree  Occupational History   Not on file  Tobacco Use   Smoking status: Every Day    Current packs/day: 0.50    Average packs/day: 0.5 packs/day for 15.0 years (7.5 ttl pk-yrs)    Types: Cigarettes   Smokeless tobacco: Never  Vaping Use   Vaping status: Never Used  Substance and Sexual Activity   Alcohol use: Yes    Alcohol/week: 5.0 - 6.0 standard drinks of alcohol    Types: 5 - 6 Standard drinks or equivalent per week    Comment: 2-3 Beers Daily   Drug use: No   Sexual activity: Yes  Other Topics Concern   Not on file  Social History Narrative   Not on file   Social Drivers of Health   Financial Resource Strain: Low Risk  (08/25/2023)   Overall Financial Resource Strain (CARDIA)    Difficulty of Paying Living Expenses: Not hard at all  Food Insecurity: No Food Insecurity (08/25/2023)   Hunger Vital Sign    Worried About Running Out of Food in the Last Year: Never true    Ran Out of Food in the Last Year: Never true  Transportation Needs: No Transportation Needs (08/25/2023)   PRAPARE - Administrator, Civil Service (Medical): No    Lack of Transportation (Non-Medical): No  Physical Activity: Insufficiently Active (08/25/2023)   Exercise Vital Sign    Days of Exercise per Week: 3 days    Minutes of Exercise per Session: 30 min  Stress: No Stress Concern Present (08/25/2023)   [employer] of Occupational Health -  Occupational Stress Questionnaire    Feeling of Stress : Only a little  Social Connections: Moderately Integrated (08/25/2023)   Social Connection and Isolation Panel    Frequency of Communication with Friends and Family: Twice a week    Frequency of Social Gatherings with Friends and Family: Once a week    Attends Religious Services: 1 to 4 times per year    Active Member of [employer] or Organizations: No    Attends Engineer, structural: Not on file    Marital Status: Married  Catering manager Violence: Not on file   Family History  Problem Relation  Age of Onset   Diabetes Mother    Hypertension Mother    Heart disease Mother    Heart failure Mother    Healthy Father    Healthy Sister    Diabetes Maternal Aunt    Diabetes Cousin    Current Outpatient Medications on File Prior to Visit  Medication Sig   atorvastatin  (LIPITOR) 20 MG tablet Take 1 tablet (20 mg total) by mouth at bedtime.   baclofen  (LIORESAL ) 10 MG tablet TAKE 1 TABLET BY MOUTH THREE TIMES A DAY   EPINEPHrine  0.3 mg/0.3 mL IJ SOAJ injection Inject 0.3 mg into the muscle as needed for anaphylaxis.   meloxicam  (MOBIC ) 15 MG tablet Take 1 tablet (15 mg total) by mouth daily as needed for pain.   omalizumab  (XOLAIR ) 150 MG injection Inject 150 mg into the skin every 28 (twenty-eight) days.   tadalafil  (CIALIS ) 20 MG tablet Take 1 tablet (20 mg total) by mouth every other day as needed for erectile dysfunction.   valsartan  (DIOVAN ) 160 MG tablet Take 1 tablet (160 mg total) by mouth daily.   No current facility-administered medications on file prior to visit.    Review of Systems  Constitutional:  Negative for activity change, appetite change, chills, diaphoresis, fatigue and fever.  HENT:  Negative for congestion and hearing loss.   Eyes:  Negative for visual disturbance.  Respiratory:  Negative for cough, chest tightness, shortness of breath and wheezing.   Cardiovascular:  Negative for chest pain,  palpitations and leg swelling.  Gastrointestinal:  Negative for abdominal pain, constipation, diarrhea, nausea and vomiting.  Genitourinary:  Negative for dysuria, frequency and hematuria.  Musculoskeletal:  Negative for arthralgias and neck pain.  Skin:  Negative for rash.  Neurological:  Negative for dizziness, weakness, light-headedness, numbness and headaches.  Hematological:  Negative for adenopathy.  Psychiatric/Behavioral:  Negative for behavioral problems, dysphoric mood and sleep disturbance.    Per HPI unless specifically indicated above     Objective:    BP (!) 140/78 (BP Location: Left Arm, Cuff Size: Normal)   Pulse 65   Ht 5' 10 (1.778 m)   Wt 233 lb 6 oz (105.9 kg)   SpO2 94%   BMI 33.49 kg/m   Wt Readings from Last 3 Encounters:  01/04/24 233 lb 6 oz (105.9 kg)  09/22/23 230 lb (104.3 kg)  08/31/23 230 lb 6 oz (104.5 kg)    Physical Exam Vitals and nursing note reviewed.  Constitutional:      General: He is not in acute distress.    Appearance: He is well-developed. He is not diaphoretic.     Comments: Well-appearing, comfortable, cooperative  HENT:     Head: Normocephalic and atraumatic.  Eyes:     General:        Right eye: No discharge.        Left eye: No discharge.     Conjunctiva/sclera: Conjunctivae normal.     Pupils: Pupils are equal, round, and reactive to light.  Neck:     Thyroid : No thyromegaly.     Vascular: No carotid bruit.  Cardiovascular:     Rate and Rhythm: Normal rate and regular rhythm.     Pulses: Normal pulses.     Heart sounds: Normal heart sounds. No murmur heard. Pulmonary:     Effort: Pulmonary effort is normal. No respiratory distress.     Breath sounds: Normal breath sounds. No wheezing or rales.  Abdominal:     General: Bowel sounds are  normal. There is no distension.     Palpations: Abdomen is soft. There is no mass.     Tenderness: There is no abdominal tenderness.  Musculoskeletal:        General: No tenderness.  Normal range of motion.     Cervical back: Normal range of motion and neck supple.     Right lower leg: No edema.     Left lower leg: No edema.     Comments: Upper / Lower Extremities: - Normal muscle tone, strength bilateral upper extremities 5/5, lower extremities 5/5  Lymphadenopathy:     Cervical: No cervical adenopathy.  Skin:    General: Skin is warm and dry.     Findings: No erythema or rash.  Neurological:     Mental Status: He is alert and oriented to person, place, and time.     Comments: Distal sensation intact to light touch all extremities  Psychiatric:        Mood and Affect: Mood normal.        Behavior: Behavior normal.        Thought Content: Thought content normal.     Comments: Well groomed, good eye contact, normal speech and thoughts       Results for orders placed or performed in visit on 12/28/23  Comprehensive metabolic panel with GFR   Collection Time: 12/28/23  8:13 AM  Result Value Ref Range   Glucose, Bld 90 65 - 99 mg/dL   BUN 14 7 - 25 mg/dL   Creat 9.08 9.29 - 8.69 mg/dL   eGFR 897 > OR = 60 fO/fpw/8.26f7   BUN/Creatinine Ratio SEE NOTE: 6 - 22 (calc)   Sodium 139 135 - 146 mmol/L   Potassium 4.1 3.5 - 5.3 mmol/L   Chloride 105 98 - 110 mmol/L   CO2 26 20 - 32 mmol/L   Calcium  8.4 (L) 8.6 - 10.3 mg/dL   Total Protein 7.6 6.1 - 8.1 g/dL   Albumin 4.1 3.6 - 5.1 g/dL   Globulin 3.5 1.9 - 3.7 g/dL (calc)   AG Ratio 1.2 1.0 - 2.5 (calc)   Total Bilirubin 0.4 0.2 - 1.2 mg/dL   Alkaline phosphatase (APISO) 70 35 - 144 U/L   AST 22 10 - 35 U/L   ALT 18 9 - 46 U/L  PSA   Collection Time: 12/28/23  8:13 AM  Result Value Ref Range   PSA 0.25 < OR = 4.00 ng/mL  CBC with Differential/Platelet   Collection Time: 12/28/23  8:13 AM  Result Value Ref Range   WBC 11.0 (H) 3.8 - 10.8 Thousand/uL   RBC 4.96 4.20 - 5.80 Million/uL   Hemoglobin 13.1 (L) 13.2 - 17.1 g/dL   HCT 59.3 61.4 - 49.9 %   MCV 81.9 80.0 - 100.0 fL   MCH 26.4 (L) 27.0 - 33.0 pg    MCHC 32.3 32.0 - 36.0 g/dL   RDW 83.8 (H) 88.9 - 84.9 %   Platelets 244 140 - 400 Thousand/uL   MPV 10.4 7.5 - 12.5 fL   Neutro Abs 5,544 1,500 - 7,800 cells/uL   Absolute Lymphocytes 4,323 (H) 850 - 3,900 cells/uL   Absolute Monocytes 891 200 - 950 cells/uL   Eosinophils Absolute 198 15 - 500 cells/uL   Basophils Absolute 44 0 - 200 cells/uL   Neutrophils Relative % 50.4 %   Total Lymphocyte 39.3 %   Monocytes Relative 8.1 %   Eosinophils Relative 1.8 %   Basophils Relative 0.4 %  Hemoglobin A1c   Collection Time: 12/28/23  8:13 AM  Result Value Ref Range   Hgb A1c MFr Bld 5.8 (H) <5.7 %   Mean Plasma Glucose 120 mg/dL   eAG (mmol/L) 6.6 mmol/L  Lipid panel   Collection Time: 12/28/23  8:13 AM  Result Value Ref Range   Cholesterol 89 <200 mg/dL   HDL 36 (L) > OR = 40 mg/dL   Triglycerides 857 <849 mg/dL   LDL Cholesterol (Calc) 30 mg/dL (calc)   Total CHOL/HDL Ratio 2.5 <5.0 (calc)   Non-HDL Cholesterol (Calc) 53 <869 mg/dL (calc)      Assessment & Plan:   Problem List Items Addressed This Visit     Essential hypertension   OSA on CPAP   Pre-diabetes   Other Visit Diagnoses       Annual physical exam    -  Primary        Updated Health Maintenance information Reviewed recent lab results with patient Encouraged improvement to lifestyle with diet and exercise Goal of weight loss   Adult Wellness Visit Annual physical examination conducted. Reviewed recent blood work and overall health status. - Recommend flu shot in the fall when available. - Discussed pneumonia vaccine (Prevnar 20) and advised checking cost at pharmacy. One shot lasts 5-10 years. - Discussed shingles vaccine (Shingrix) and advised obtaining at pharmacy, two doses 2-6 months apart. Noted increased side effects compared to pneumonia vaccine.  Hypertension Blood pressure elevated at 150/80 initially, improved to 140/78 on recheck. Stress related to caregiving may contribute. Home blood  pressure monitoring not recently conducted. - Monitor blood pressure at home. On Valsartan  160mg  daily - Consider referral to support services [facility] if blood pressure remains elevated. We discussed issues with caregiver stress with his father with alzheimer's he may benefit from RN CM + LCSW referral  Obstructive sleep apnea on CPAP therapy CPAP usage at 56% over 30 days, below required 70% for insurance. Improvement in symptoms noted with CPAP use. - Increase CPAP usage to meet 70% compliance. - Follow up in 4 weeks to reassess CPAP usage and blood pressure. Will need office visit note for CPAP Compliance faxed to [facility] 30-90 days, start date was determined as 11/17/23  Type 2 diabetes mellitus A1C improved to 5.8, down from previous 6.0 and 6.3. Diabetes management effective with current regimen.  Hyperlipidemia Cholesterol levels well-controlled with current medication. LDL at 30, indicating effective management.  General Health Maintenance Reviewed immunization status and recent colonoscopy. Discussed upcoming vaccinations and his benefits. - Recommend flu shot in the fall. - Consider pneumonia and shingles vaccines as discussed. - Colonoscopy not needed until 2034.        No orders of the defined types were placed in this encounter.   No orders of the defined types were placed in this encounter.    Follow up plan: Return in about 4 weeks (around 02/01/2024) for 4 weeks follow-up HTN, OSA CPAP compliance fax to [facility].  Marsa Officer, DO [facility] 01/04/2024, 3:54 PM

## 2024-01-04 NOTE — Patient Instructions (Addendum)
 Thank you for coming to the office today.  Prevnar-20 Pneumonia shot check cost at pharmacy. One dose lasts 5-10 years  Shingles Vaccine - Shingrix at the pharmacy, 2 doses, 2-6 months apart, can have fever flu-like symptoms after you get the vaccine.  BP improved  Please schedule a Follow-up Appointment to: Return in about 4 weeks (around 02/01/2024) for 4 weeks follow-up HTN, OSA CPAP compliance fax to [facility].  If you have any other questions or concerns, please feel free to call the office or send a message through MyChart. You may also schedule an earlier appointment if necessary.  Additionally, you may be receiving a survey about your experience at our office within a few days to 1 week by e-mail or mail. We value your feedback.  Marsa Officer, DO [facility], [place]

## 2024-01-18 DIAGNOSIS — G4733 Obstructive sleep apnea (adult) (pediatric): Secondary | ICD-10-CM | POA: Diagnosis not present

## 2024-02-02 ENCOUNTER — Encounter: Payer: Self-pay | Admitting: Family Medicine

## 2024-02-02 ENCOUNTER — Ambulatory Visit: Admitting: Family Medicine

## 2024-02-02 VITALS — BP 140/82 | HR 63 | Ht 70.0 in | Wt 234.4 lb

## 2024-02-02 DIAGNOSIS — I872 Venous insufficiency (chronic) (peripheral): Secondary | ICD-10-CM | POA: Insufficient documentation

## 2024-02-02 DIAGNOSIS — I10 Essential (primary) hypertension: Secondary | ICD-10-CM

## 2024-02-02 DIAGNOSIS — Z23 Encounter for immunization: Secondary | ICD-10-CM

## 2024-02-02 DIAGNOSIS — G4733 Obstructive sleep apnea (adult) (pediatric): Secondary | ICD-10-CM

## 2024-02-02 DIAGNOSIS — N529 Male erectile dysfunction, unspecified: Secondary | ICD-10-CM

## 2024-02-02 NOTE — Patient Instructions (Addendum)
 Thank you for coming to the office today.  Keep improving overall  I will fax [facility] with the compliance report.  Edema / Venous Insufficiency / gravity dependent  Use RICE therapy: - R - Rest / relative rest with activity modification avoid overuse of joint - I - Ice packs (make sure you use a towel or sock / something to protect skin) - C - Compression with sock if interested / apply pressure and reduce swelling allowing more support - E - Elevation - if significant swelling, lift leg above heart level (toes above your nose) to help reduce swelling, most helpful at night after day of being on your feet   Please schedule a Follow-up Appointment to: Return if symptoms worsen or fail to improve.  If you have any other questions or concerns, please feel free to call the office or send a message through MyChart. You may also schedule an earlier appointment if necessary.  Additionally, you may be receiving a survey about your experience at our office within a few days to 1 week by e-mail or mail. We value your feedback.  Marsa Officer, DO [facility], [place]

## 2024-02-02 NOTE — Progress Notes (Unsigned)
 Subjective:    Patient ID: Aaron Woodard, male    DOB: May 30, 1970, 53 y.o.   MRN: [id number]  Aaron Woodard is a 53 y.o. male presenting on 02/02/2024 for Sleep Apnea and Hypertension   HPI  Discussed the use of AI scribe software for clinical note transcription with the patient, who gave verbal consent to proceed.  History of Present Illness   Aaron Woodard is a 53 year old male with obstructive sleep apnea who presents for a CPAP compliance visit.  Erectile dysfunction - Erectile dysfunction present, with partial improvement since starting CPAP therapy - Limited success on PDE5, Sildenafil  and Tadalafil  - Current use of tadalafil , taken every other day for a week to achieve desired effect - Effectiveness of tadalafil  is inconsistent - Prior urology consultation resulted in adjustment of antihypertensive medication taken off thiazide and recommendation for CPAP therapy - he has not returned to Urology yet but will reconsider  Venous Insufficiency w/ Peripheral edema - Swelling in feet and ankles, more pronounced later in the day after prolonged standing - Swelling noticed when removing socks after work - Described as 'annoying' but not painful - Edema attributed to changes in antihypertensive medication. When he was discontinued on hydrochlorothiazide  he has had inc in swelling - History of similar swelling with Amlodipine  use, remains off med  Hypertension - Elevated blood pressure reading in clinic today - Home blood pressure readings typically 130-135/80-90 mmHg - Attributes elevated blood pressure to stress related to caregiving for father with dementia Current Meds - Valsartan  160mg  daily Off thaizide, amlodipine  No side effect or issue Denies CP, dyspnea, HA, edema, dizziness / lightheadedness  OSA, on CPAP Relatively new diagnosis OSA, with recent sleep study - Using CPAP machine provided by [facility] since late June - Originally had poor adherence due to various  factors that impacted his ability to adhere to treatment - Now in past 30 days 8/18 to 9/15, he has had 96.7% utilization rate and much better success - Experiencing less daytime sleepiness since starting CPAP, but some fatigue persists  - Today reports that sleep apnea is improved control. Tolerates the machine well, and thinks that sleeps better with it and feels good. No new concerns or symptoms. - He has overall had major improvement on CPAP   Health Maintenance: Flu Shot     08/31/2023   12:57 PM 01/20/2023    3:23 PM 08/19/2022    8:26 AM  Depression screen PHQ 2/9  Decreased Interest 0 0 0  Down, Depressed, Hopeless 0 0 0  PHQ - 2 Score 0 0 0  Altered sleeping   1  Tired, decreased energy 1  1  Change in appetite 1  0  Feeling bad or failure about yourself  0  0  Trouble concentrating 0  0  Moving slowly or fidgety/restless 0  0  Suicidal thoughts 0  0  PHQ-9 Score   2  Difficult doing work/chores Not difficult at all  Not difficult at all       08/31/2023   12:57 PM 01/20/2023    3:24 PM 08/19/2022    8:26 AM 03/31/2022    3:03 PM  GAD 7 : Generalized Anxiety Score  Nervous, Anxious, on Edge 0 0 0 0  Control/stop worrying 0 0 0 0  Worry too much - different things 1 0 0 0  Trouble relaxing 0 0 0 0  Restless 0 0 0 0  Easily annoyed or irritable 1 0 0 0  Afraid -  awful might happen 0 0 0 0  Total GAD 7 Score 2 0 0 0  Anxiety Difficulty Not difficult at all  Not difficult at all Not difficult at all    Social History   Tobacco Use   Smoking status: Every Day    Current packs/day: 0.50    Average packs/day: 0.5 packs/day for 15.0 years (7.5 ttl pk-yrs)    Types: Cigarettes   Smokeless tobacco: Never  Vaping Use   Vaping status: Never Used  Substance Use Topics   Alcohol use: Yes    Alcohol/week: 5.0 - 6.0 standard drinks of alcohol    Types: 5 - 6 Standard drinks or equivalent per week    Comment: 2-3 Beers Daily   Drug use: No    Review of Systems Per HPI  unless specifically indicated above     Objective:    BP (!) 140/82 (BP Location: Left Arm, Cuff Size: Normal)   Pulse 63   Ht 5' 10 (1.778 m)   Wt 234 lb 6 oz (106.3 kg)   SpO2 98%   BMI 33.63 kg/m   Wt Readings from Last 3 Encounters:  02/02/24 234 lb 6 oz (106.3 kg)  01/04/24 233 lb 6 oz (105.9 kg)  09/22/23 230 lb (104.3 kg)    Physical Exam Vitals and nursing note reviewed.  Constitutional:      General: He is not in acute distress.    Appearance: He is well-developed. He is not diaphoretic.     Comments: Well-appearing, comfortable, cooperative  HENT:     Head: Normocephalic and atraumatic.  Eyes:     General:        Right eye: No discharge.        Left eye: No discharge.     Conjunctiva/sclera: Conjunctivae normal.  Neck:     Thyroid : No thyromegaly.  Cardiovascular:     Rate and Rhythm: Normal rate and regular rhythm.     Pulses: Normal pulses.     Heart sounds: Normal heart sounds. No murmur heard. Pulmonary:     Effort: Pulmonary effort is normal. No respiratory distress.     Breath sounds: Normal breath sounds. No wheezing or rales.  Musculoskeletal:        General: Normal range of motion.     Cervical back: Normal range of motion and neck supple.     Right lower leg: Edema (bilateral symmetrical lower leg +1 pitting edema) present.     Left lower leg: Edema present.  Lymphadenopathy:     Cervical: No cervical adenopathy.  Skin:    General: Skin is warm and dry.     Findings: No erythema or rash.  Neurological:     Mental Status: He is alert and oriented to person, place, and time. Mental status is at baseline.  Psychiatric:        Behavior: Behavior normal.     Comments: Well groomed, good eye contact, normal speech and thoughts     Results for orders placed or performed in visit on 12/28/23  Comprehensive metabolic panel with GFR   Collection Time: 12/28/23  8:13 AM  Result Value Ref Range   Glucose, Bld 90 65 - 99 mg/dL   BUN 14 7 - 25  mg/dL   Creat 9.08 9.29 - 8.69 mg/dL   eGFR 897 > OR = 60 fO/fpw/8.26f7   BUN/Creatinine Ratio SEE NOTE: 6 - 22 (calc)   Sodium 139 135 - 146 mmol/L   Potassium 4.1 3.5 -  5.3 mmol/L   Chloride 105 98 - 110 mmol/L   CO2 26 20 - 32 mmol/L   Calcium  8.4 (L) 8.6 - 10.3 mg/dL   Total Protein 7.6 6.1 - 8.1 g/dL   Albumin 4.1 3.6 - 5.1 g/dL   Globulin 3.5 1.9 - 3.7 g/dL (calc)   AG Ratio 1.2 1.0 - 2.5 (calc)   Total Bilirubin 0.4 0.2 - 1.2 mg/dL   Alkaline phosphatase (APISO) 70 35 - 144 U/L   AST 22 10 - 35 U/L   ALT 18 9 - 46 U/L  PSA   Collection Time: 12/28/23  8:13 AM  Result Value Ref Range   PSA 0.25 < OR = 4.00 ng/mL  CBC with Differential/Platelet   Collection Time: 12/28/23  8:13 AM  Result Value Ref Range   WBC 11.0 (H) 3.8 - 10.8 Thousand/uL   RBC 4.96 4.20 - 5.80 Million/uL   Hemoglobin 13.1 (L) 13.2 - 17.1 g/dL   HCT 59.3 61.4 - 49.9 %   MCV 81.9 80.0 - 100.0 fL   MCH 26.4 (L) 27.0 - 33.0 pg   MCHC 32.3 32.0 - 36.0 g/dL   RDW 83.8 (H) 88.9 - 84.9 %   Platelets 244 140 - 400 Thousand/uL   MPV 10.4 7.5 - 12.5 fL   Neutro Abs 5,544 1,500 - 7,800 cells/uL   Absolute Lymphocytes 4,323 (H) 850 - 3,900 cells/uL   Absolute Monocytes 891 200 - 950 cells/uL   Eosinophils Absolute 198 15 - 500 cells/uL   Basophils Absolute 44 0 - 200 cells/uL   Neutrophils Relative % 50.4 %   Total Lymphocyte 39.3 %   Monocytes Relative 8.1 %   Eosinophils Relative 1.8 %   Basophils Relative 0.4 %  Hemoglobin A1c   Collection Time: 12/28/23  8:13 AM  Result Value Ref Range   Hgb A1c MFr Bld 5.8 (H) <5.7 %   Mean Plasma Glucose 120 mg/dL   eAG (mmol/L) 6.6 mmol/L  Lipid panel   Collection Time: 12/28/23  8:13 AM  Result Value Ref Range   Cholesterol 89 <200 mg/dL   HDL 36 (L) > OR = 40 mg/dL   Triglycerides 857 <849 mg/dL   LDL Cholesterol (Calc) 30 mg/dL (calc)   Total CHOL/HDL Ratio 2.5 <5.0 (calc)   Non-HDL Cholesterol (Calc) 53 <869 mg/dL (calc)      Assessment & Plan:    Problem List Items Addressed This Visit     Essential hypertension   OSA on CPAP - Primary   Venous insufficiency of both lower extremities   Other Visit Diagnoses       Flu vaccine need       Relevant Orders   Flu vaccine trivalent PF, 6mos and older(Flulaval,Afluria,Fluarix,Fluzone) (Completed)     Erectile dysfunction, unspecified erectile dysfunction type          Erectile dysfunction Partial improvement with limited success from sildenafil  and tadalafil . Injections suggested by urologist as alternative. - Continue tadalafil . - Reassess erectile function after six months of CPAP use see if it improves with improved sleep and hopefully BP control - He has already established with [facility], he will follow up to discuss trimix injections if still remains a problem within next 6 motnhs  Venous insufficiency with lower extremity edema Edema likely due to discontinuation of diuretic thiazide, more pronounced later in the day, not causing discomfort. Appears benign No longer on amlodipine  and still has edema. - Advise RICE for edema management.  Essential hypertension Blood  pressure at 144/82 mmHg, managed with current medication. Edema likely related to venous insufficiency. - Continue current antihypertensive medication.  Valsartan  160mg  daily May warrant 2nd agent if not improving on CPAP  Obstructive sleep apnea OSA on CPAP Controlled on CPAP DME company = [facility] Improved CPAP compliance at 96.7% over 30 days. Residual tiredness persists but much improved - Continue CPAP therapy. - He is compliant and using CPAP with nightly adherence. - He tolerates CPAP therapy well and he benefits from CPAP therapy  CPAP Compliance Certification [facility] Fax [phone] ***   Orders Placed This Encounter  Procedures   Flu vaccine trivalent PF, 6mos and older(Flulaval,Afluria,Fluarix,Fluzone)    No orders of the defined types were placed in this encounter.   Follow up  plan: Return if symptoms worsen or fail to improve.  Marsa Officer, DO [facility] 02/02/2024, 4:13 PM

## 2024-02-17 DIAGNOSIS — G4733 Obstructive sleep apnea (adult) (pediatric): Secondary | ICD-10-CM | POA: Diagnosis not present

## 2024-03-15 DIAGNOSIS — H40013 Open angle with borderline findings, low risk, bilateral: Secondary | ICD-10-CM | POA: Diagnosis not present

## 2024-04-06 ENCOUNTER — Other Ambulatory Visit: Payer: Self-pay | Admitting: Family Medicine

## 2024-04-06 DIAGNOSIS — G8929 Other chronic pain: Secondary | ICD-10-CM

## 2024-04-06 DIAGNOSIS — M150 Primary generalized (osteo)arthritis: Secondary | ICD-10-CM

## 2024-04-08 NOTE — Telephone Encounter (Signed)
 Rx 10/28/23 #90 1RF- too soon Requested Prescriptions  Pending Prescriptions Disp Refills   meloxicam  (MOBIC ) 15 MG tablet [Pharmacy Med Name: MELOXICAM  15 MG TABLET] 90 tablet 1    Sig: TAKE 1 TABLET BY MOUTH EVERY DAY AS NEEDED FOR PAIN     Analgesics:  COX2 Inhibitors Failed - 04/08/2024 12:10 PM      Failed - Manual Review: Labs are only required if the patient has taken medication for more than 8 weeks.      Failed - HGB in normal range and within 360 days    Hemoglobin  Date Value Ref Range Status  12/28/2023 13.1 (L) 13.2 - 17.1 g/dL Final         Passed - Cr in normal range and within 360 days    Creat  Date Value Ref Range Status  12/28/2023 0.91 0.70 - 1.30 mg/dL Final         Passed - HCT in normal range and within 360 days    HCT  Date Value Ref Range Status  12/28/2023 40.6 38.5 - 50.0 % Final         Passed - AST in normal range and within 360 days    AST  Date Value Ref Range Status  12/28/2023 22 10 - 35 U/L Final         Passed - ALT in normal range and within 360 days    ALT  Date Value Ref Range Status  12/28/2023 18 9 - 46 U/L Final         Passed - eGFR is 30 or above and within 360 days    GFR calc Af Amer  Date Value Ref Range Status  12/21/2019 >60 >60 mL/min Final   GFR, Estimated  Date Value Ref Range Status  08/17/2020 >60 >60 mL/min Final    Comment:    (NOTE) Calculated using the CKD-EPI Creatinine Equation (2021)    eGFR  Date Value Ref Range Status  12/28/2023 102 > OR = 60 mL/min/1.35m2 Final         Passed - Patient is not pregnant      Passed - Valid encounter within last 12 months    Recent Outpatient Visits           2 months ago OSA on CPAP   [place] [facility] [place], Marsa PARAS, DO   3 months ago Annual physical exam   [place] [facility] [place] Marsa PARAS, DO   7 months ago Pre-diabetes   [place] [facility] [place], Marsa PARAS, [place]

## 2024-04-18 DIAGNOSIS — G4733 Obstructive sleep apnea (adult) (pediatric): Secondary | ICD-10-CM | POA: Diagnosis not present

## 2024-04-19 ENCOUNTER — Encounter: Payer: Self-pay | Admitting: Family Medicine

## 2024-04-19 DIAGNOSIS — I10 Essential (primary) hypertension: Secondary | ICD-10-CM

## 2024-04-19 MED ORDER — VALSARTAN 160 MG PO TABS: 160.0000 mg | ORAL_TABLET | Freq: Every day | ORAL | 3 refills | Status: AC

## 2024-06-06 ENCOUNTER — Ambulatory Visit: Admitting: Family Medicine

## 2024-06-29 ENCOUNTER — Ambulatory Visit: Admitting: Cardiovascular Disease

## 2024-07-18 ENCOUNTER — Ambulatory Visit: Admitting: Family Medicine

## 2024-09-21 ENCOUNTER — Ambulatory Visit: Admitting: Cardiovascular Disease
